# Patient Record
Sex: Male | Born: 1945 | Race: White | Hispanic: No | Marital: Married | Smoking: Current every day smoker
Health system: Southern US, Community
[De-identification: ages and names within clinical notes are randomized; demographics above are authoritative.]

## PROBLEM LIST (undated history)

## (undated) DIAGNOSIS — E785 Hyperlipidemia, unspecified: Secondary | ICD-10-CM

## (undated) DIAGNOSIS — R55 Syncope and collapse: Secondary | ICD-10-CM

## (undated) DIAGNOSIS — I10 Essential (primary) hypertension: Secondary | ICD-10-CM

## (undated) DIAGNOSIS — J449 Chronic obstructive pulmonary disease, unspecified: Secondary | ICD-10-CM

## (undated) DIAGNOSIS — I2510 Atherosclerotic heart disease of native coronary artery without angina pectoris: Secondary | ICD-10-CM

## (undated) HISTORY — DX: Chronic obstructive pulmonary disease, unspecified: J44.9

## (undated) HISTORY — DX: Syncope and collapse: R55

## (undated) HISTORY — PX: COLONOSCOPY: SHX174

## (undated) HISTORY — PX: TONSILLECTOMY: SUR1361

## (undated) HISTORY — DX: Atherosclerotic heart disease of native coronary artery without angina pectoris: I25.10

## (undated) HISTORY — PX: OTHER SURGICAL HISTORY: SHX169

## (undated) HISTORY — DX: Essential (primary) hypertension: I10

## (undated) HISTORY — DX: Hyperlipidemia, unspecified: E78.5

---

## 1997-11-15 ENCOUNTER — Emergency Department (HOSPITAL_COMMUNITY): Admission: EM | Admit: 1997-11-15 | Discharge: 1997-11-15 | Payer: Self-pay | Admitting: Emergency Medicine

## 1997-11-23 ENCOUNTER — Ambulatory Visit (HOSPITAL_COMMUNITY): Admission: RE | Admit: 1997-11-23 | Discharge: 1997-11-23 | Payer: Self-pay | Admitting: Internal Medicine

## 1998-10-12 ENCOUNTER — Emergency Department (HOSPITAL_COMMUNITY): Admission: EM | Admit: 1998-10-12 | Discharge: 1998-10-12 | Payer: Self-pay | Admitting: Emergency Medicine

## 1998-10-12 ENCOUNTER — Encounter: Payer: Self-pay | Admitting: Emergency Medicine

## 1999-10-02 ENCOUNTER — Encounter: Admission: RE | Admit: 1999-10-02 | Discharge: 1999-10-02 | Payer: Self-pay | Admitting: Internal Medicine

## 1999-10-02 ENCOUNTER — Encounter: Payer: Self-pay | Admitting: Internal Medicine

## 2005-10-11 ENCOUNTER — Ambulatory Visit: Payer: Self-pay | Admitting: Internal Medicine

## 2005-10-29 ENCOUNTER — Ambulatory Visit: Payer: Self-pay | Admitting: Internal Medicine

## 2005-10-29 ENCOUNTER — Encounter: Payer: Self-pay | Admitting: Internal Medicine

## 2008-07-26 HISTORY — PX: CORONARY ARTERY BYPASS GRAFT: SHX141

## 2008-07-27 ENCOUNTER — Inpatient Hospital Stay (HOSPITAL_COMMUNITY): Admission: EM | Admit: 2008-07-27 | Discharge: 2008-08-07 | Payer: Self-pay | Admitting: Emergency Medicine

## 2008-07-28 ENCOUNTER — Encounter: Payer: Self-pay | Admitting: Cardiothoracic Surgery

## 2008-07-28 ENCOUNTER — Ambulatory Visit: Payer: Self-pay | Admitting: Cardiothoracic Surgery

## 2008-07-28 HISTORY — PX: CARDIAC CATHETERIZATION: SHX172

## 2008-07-29 ENCOUNTER — Encounter: Payer: Self-pay | Admitting: Cardiothoracic Surgery

## 2008-07-29 HISTORY — PX: TRANSTHORACIC ECHOCARDIOGRAM: SHX275

## 2008-08-30 ENCOUNTER — Ambulatory Visit: Payer: Self-pay | Admitting: Cardiothoracic Surgery

## 2008-08-30 ENCOUNTER — Encounter: Admission: RE | Admit: 2008-08-30 | Discharge: 2008-08-30 | Payer: Self-pay | Admitting: Cardiothoracic Surgery

## 2010-02-03 ENCOUNTER — Emergency Department (HOSPITAL_COMMUNITY): Admission: EM | Admit: 2010-02-03 | Discharge: 2010-02-03 | Payer: Self-pay | Admitting: Emergency Medicine

## 2010-02-24 DIAGNOSIS — R55 Syncope and collapse: Secondary | ICD-10-CM

## 2010-02-24 HISTORY — DX: Syncope and collapse: R55

## 2010-08-10 LAB — DIFFERENTIAL
Basophils Absolute: 0.1 K/uL (ref 0.0–0.1)
Basophils Relative: 0 % (ref 0–1)
Eosinophils Absolute: 0.1 K/uL (ref 0.0–0.7)
Eosinophils Relative: 0 % (ref 0–5)
Lymphocytes Relative: 14 % (ref 12–46)
Lymphs Abs: 2.8 K/uL (ref 0.7–4.0)
Monocytes Absolute: 1 K/uL (ref 0.1–1.0)
Monocytes Relative: 5 % (ref 3–12)
Neutro Abs: 16.1 10*3/uL — ABNORMAL HIGH (ref 1.7–7.7)
Neutrophils Relative %: 80 % — ABNORMAL HIGH (ref 43–77)

## 2010-08-10 LAB — BASIC METABOLIC PANEL
Calcium: 9.1 mg/dL (ref 8.4–10.5)
GFR calc Af Amer: >60 mL/min (ref >60)
GFR calc non Af Amer: >60 mL/min (ref >60)
Potassium: 5.3 mEq/L — ABNORMAL HIGH (ref 3.5–5.1)
Sodium: 137 mEq/L (ref 135–145)

## 2010-08-10 LAB — BASIC METABOLIC PANEL WITH GFR
BUN: 11 mg/dL (ref 6–23)
CO2: 21 meq/L (ref 19–32)
Chloride: 108 meq/L (ref 96–112)
Creatinine, Ser: 1.04 mg/dL (ref 0.4–1.5)
Glucose, Bld: 119 mg/dL — ABNORMAL HIGH (ref 70–99)

## 2010-08-10 LAB — POCT CARDIAC MARKERS
CKMB, poc: <1 ng/mL — ABNORMAL LOW (ref 1.0–8.0)
CKMB, poc: <1 ng/mL — ABNORMAL LOW (ref 1.0–8.0)
Myoglobin, poc: 52.1 ng/mL (ref 12–200)
Myoglobin, poc: 55.8 ng/mL (ref 12–200)
Troponin i, poc: <0.05 ng/mL (ref 0.00–0.09)
Troponin i, poc: <0.05 ng/mL (ref 0.00–0.09)

## 2010-08-10 LAB — CBC
HCT: 45.2 % (ref 39.0–52.0)
Hemoglobin: 15.6 g/dL (ref 13.0–17.0)
MCH: 32 pg (ref 26.0–34.0)
MCHC: 34.5 g/dL (ref 30.0–36.0)
MCV: 92.8 fL (ref 78.0–100.0)
Platelets: 220 K/uL (ref 150–400)
RBC: 4.87 MIL/uL (ref 4.22–5.81)
RDW: 13.2 % (ref 11.5–15.5)
WBC: 20.1 10*3/uL — ABNORMAL HIGH (ref 4.0–10.5)

## 2010-08-10 LAB — D-DIMER, QUANTITATIVE: D-Dimer, Quant: <0.22 ug{FEU}/mL (ref 0.00–0.48)

## 2010-08-10 LAB — PROTIME-INR
INR: 1.07 (ref 0.00–1.49)
Prothrombin Time: 14.1 seconds (ref 11.6–15.2)

## 2010-08-10 LAB — APTT: aPTT: 27 s (ref 24–37)

## 2010-08-10 LAB — GLUCOSE, CAPILLARY: Glucose-Capillary: 131 mg/dL — ABNORMAL HIGH (ref 70–99)

## 2010-09-07 LAB — BASIC METABOLIC PANEL
BUN: 11 mg/dL (ref 6–23)
BUN: 11 mg/dL (ref 6–23)
BUN: 12 mg/dL (ref 6–23)
BUN: 13 mg/dL (ref 6–23)
CO2: 24 mEq/L (ref 19–32)
CO2: 25 mEq/L (ref 19–32)
CO2: 29 mEq/L (ref 19–32)
CO2: 29 mEq/L (ref 19–32)
Calcium: 8.2 mg/dL — ABNORMAL LOW (ref 8.4–10.5)
Calcium: 8.2 mg/dL — ABNORMAL LOW (ref 8.4–10.5)
Calcium: 8.3 mg/dL — ABNORMAL LOW (ref 8.4–10.5)
Calcium: 8.5 mg/dL (ref 8.4–10.5)
Chloride: 101 mEq/L (ref 96–112)
Chloride: 103 mEq/L (ref 96–112)
Chloride: 105 mEq/L (ref 96–112)
Chloride: 108 mEq/L (ref 96–112)
Creatinine, Ser: 0.74 mg/dL (ref 0.4–1.5)
Creatinine, Ser: 0.8 mg/dL (ref 0.4–1.5)
Creatinine, Ser: 0.8 mg/dL (ref 0.4–1.5)
Creatinine, Ser: 0.82 mg/dL (ref 0.4–1.5)
GFR calc Af Amer: >60 mL/min (ref >60)
GFR calc Af Amer: >60 mL/min (ref >60)
GFR calc Af Amer: >60 mL/min (ref >60)
GFR calc Af Amer: >60 mL/min (ref >60)
GFR calc non Af Amer: >60 mL/min (ref >60)
GFR calc non Af Amer: >60 mL/min (ref >60)
GFR calc non Af Amer: >60 mL/min (ref >60)
GFR calc non Af Amer: >60 mL/min (ref >60)
Glucose, Bld: 102 mg/dL — ABNORMAL HIGH (ref 70–99)
Glucose, Bld: 111 mg/dL — ABNORMAL HIGH (ref 70–99)
Glucose, Bld: 112 mg/dL — ABNORMAL HIGH (ref 70–99)
Glucose, Bld: 121 mg/dL — ABNORMAL HIGH (ref 70–99)
Potassium: 3.6 mEq/L (ref 3.5–5.1)
Potassium: 3.9 mEq/L (ref 3.5–5.1)
Potassium: 4 mEq/L (ref 3.5–5.1)
Potassium: 4.2 mEq/L (ref 3.5–5.1)
Sodium: 137 mEq/L (ref 135–145)
Sodium: 138 mEq/L (ref 135–145)
Sodium: 138 mEq/L (ref 135–145)
Sodium: 138 mEq/L (ref 135–145)

## 2010-09-07 LAB — BLOOD GAS, ARTERIAL
Acid-Base Excess: 0.5 mmol/L (ref 0.0–2.0)
Acid-Base Excess: 0.9 mmol/L (ref 0.0–2.0)
Bicarbonate: 24.7 mEq/L — ABNORMAL HIGH (ref 20.0–24.0)
Bicarbonate: 25.1 mEq/L — ABNORMAL HIGH (ref 20.0–24.0)
FIO2: 0.21 %
FIO2: 0.21 %
O2 Saturation: 95.5 %
O2 Saturation: 96.1 %
Patient temperature: 98.6
Patient temperature: 98.6
TCO2: 25.9 mmol/L (ref 0–100)
TCO2: 26.4 mmol/L (ref 0–100)
pCO2 arterial: 39.8 mmHg (ref 35.0–45.0)
pCO2 arterial: 41.5 mmHg (ref 35.0–45.0)
pH, Arterial: 7.4 (ref 7.350–7.450)
pH, Arterial: 7.408 (ref 7.350–7.450)
pO2, Arterial: 73.1 mmHg — ABNORMAL LOW (ref 80.0–100.0)
pO2, Arterial: 80.6 mmHg (ref 80.0–100.0)

## 2010-09-07 LAB — CBC
HCT: 26.5 % — ABNORMAL LOW (ref 39.0–52.0)
HCT: 27 % — ABNORMAL LOW (ref 39.0–52.0)
HCT: 27.3 % — ABNORMAL LOW (ref 39.0–52.0)
HCT: 27.9 % — ABNORMAL LOW (ref 39.0–52.0)
HCT: 28.1 % — ABNORMAL LOW (ref 39.0–52.0)
HCT: 29.3 % — ABNORMAL LOW (ref 39.0–52.0)
HCT: 30.5 % — ABNORMAL LOW (ref 39.0–52.0)
HCT: 41.2 % (ref 39.0–52.0)
HCT: 41.4 % (ref 39.0–52.0)
HCT: 42.6 % (ref 39.0–52.0)
HCT: 42.7 % (ref 39.0–52.0)
HCT: 43.4 % (ref 39.0–52.0)
HCT: 44.7 % (ref 39.0–52.0)
Hemoglobin: 10.4 g/dL — ABNORMAL LOW (ref 13.0–17.0)
Hemoglobin: 10.7 g/dL — ABNORMAL LOW (ref 13.0–17.0)
Hemoglobin: 14.2 g/dL (ref 13.0–17.0)
Hemoglobin: 14.3 g/dL (ref 13.0–17.0)
Hemoglobin: 14.8 g/dL (ref 13.0–17.0)
Hemoglobin: 14.8 g/dL (ref 13.0–17.0)
Hemoglobin: 15.6 g/dL (ref 13.0–17.0)
Hemoglobin: 9.4 g/dL — ABNORMAL LOW (ref 13.0–17.0)
Hemoglobin: 9.6 g/dL — ABNORMAL LOW (ref 13.0–17.0)
Hemoglobin: 9.7 g/dL — ABNORMAL LOW (ref 13.0–17.0)
Hemoglobin: 9.8 g/dL — ABNORMAL LOW (ref 13.0–17.0)
Hemoglobin: 9.8 g/dL — ABNORMAL LOW (ref 13.0–17.0)
MCHC: 34.3 g/dL (ref 30.0–36.0)
MCHC: 34.4 g/dL (ref 30.0–36.0)
MCHC: 34.5 g/dL (ref 30.0–36.0)
MCHC: 34.7 g/dL (ref 30.0–36.0)
MCHC: 34.8 g/dL (ref 30.0–36.0)
MCHC: 34.9 g/dL (ref 30.0–36.0)
MCHC: 34.9 g/dL (ref 30.0–36.0)
MCHC: 34.9 g/dL (ref 30.0–36.0)
MCHC: 35.1 g/dL (ref 30.0–36.0)
MCHC: 35.2 g/dL (ref 30.0–36.0)
MCHC: 35.5 g/dL (ref 30.0–36.0)
MCHC: 35.7 g/dL (ref 30.0–36.0)
MCHC: 35.9 g/dL (ref 30.0–36.0)
MCHC: 36 g/dL (ref 30.0–36.0)
MCV: 91.7 fL (ref 78.0–100.0)
MCV: 92.1 fL (ref 78.0–100.0)
MCV: 92.4 fL (ref 78.0–100.0)
MCV: 92.5 fL (ref 78.0–100.0)
MCV: 92.7 fL (ref 78.0–100.0)
MCV: 92.8 fL (ref 78.0–100.0)
MCV: 92.9 fL (ref 78.0–100.0)
MCV: 93.4 fL (ref 78.0–100.0)
MCV: 93.5 fL (ref 78.0–100.0)
MCV: 93.5 fL (ref 78.0–100.0)
MCV: 93.6 fL (ref 78.0–100.0)
MCV: 94.6 fL (ref 78.0–100.0)
Platelets: 119 10*3/uL — ABNORMAL LOW (ref 150–400)
Platelets: 132 10*3/uL — ABNORMAL LOW (ref 150–400)
Platelets: 140 10*3/uL — ABNORMAL LOW (ref 150–400)
Platelets: 143 10*3/uL — ABNORMAL LOW (ref 150–400)
Platelets: 152 10*3/uL (ref 150–400)
Platelets: 167 10*3/uL (ref 150–400)
Platelets: 184 10*3/uL (ref 150–400)
Platelets: 200 10*3/uL (ref 150–400)
Platelets: 203 10*3/uL (ref 150–400)
Platelets: 207 10*3/uL (ref 150–400)
Platelets: 216 10*3/uL (ref 150–400)
Platelets: 217 10*3/uL (ref 150–400)
Platelets: 230 10*3/uL (ref 150–400)
RBC: 2.86 MIL/uL — ABNORMAL LOW (ref 4.22–5.81)
RBC: 2.93 MIL/uL — ABNORMAL LOW (ref 4.22–5.81)
RBC: 2.94 MIL/uL — ABNORMAL LOW (ref 4.22–5.81)
RBC: 2.97 MIL/uL — ABNORMAL LOW (ref 4.22–5.81)
RBC: 3 MIL/uL — ABNORMAL LOW (ref 4.22–5.81)
RBC: 3.2 MIL/uL — ABNORMAL LOW (ref 4.22–5.81)
RBC: 3.29 MIL/uL — ABNORMAL LOW (ref 4.22–5.81)
RBC: 4.41 MIL/uL (ref 4.22–5.81)
RBC: 4.43 MIL/uL (ref 4.22–5.81)
RBC: 4.83 MIL/uL (ref 4.22–5.81)
RDW: 13.4 % (ref 11.5–15.5)
RDW: 13.4 % (ref 11.5–15.5)
RDW: 13.5 % (ref 11.5–15.5)
RDW: 13.6 % (ref 11.5–15.5)
RDW: 13.7 % (ref 11.5–15.5)
RDW: 13.7 % (ref 11.5–15.5)
RDW: 13.8 % (ref 11.5–15.5)
RDW: 13.8 % (ref 11.5–15.5)
RDW: 13.9 % (ref 11.5–15.5)
RDW: 14 % (ref 11.5–15.5)
RDW: 14 % (ref 11.5–15.5)
RDW: 14 % (ref 11.5–15.5)
RDW: 14.2 % (ref 11.5–15.5)
WBC: 10.2 10*3/uL (ref 4.0–10.5)
WBC: 11.4 10*3/uL — ABNORMAL HIGH (ref 4.0–10.5)
WBC: 11.6 10*3/uL — ABNORMAL HIGH (ref 4.0–10.5)
WBC: 12 10*3/uL — ABNORMAL HIGH (ref 4.0–10.5)
WBC: 12.2 10*3/uL — ABNORMAL HIGH (ref 4.0–10.5)
WBC: 13.4 10*3/uL — ABNORMAL HIGH (ref 4.0–10.5)
WBC: 14.5 10*3/uL — ABNORMAL HIGH (ref 4.0–10.5)
WBC: 15.5 10*3/uL — ABNORMAL HIGH (ref 4.0–10.5)
WBC: 17.4 10*3/uL — ABNORMAL HIGH (ref 4.0–10.5)
WBC: 9.6 10*3/uL (ref 4.0–10.5)

## 2010-09-07 LAB — POCT I-STAT 3, ART BLOOD GAS (G3+)
Acid-Base Excess: 1 mmol/L (ref 0.0–2.0)
Bicarbonate: 24.7 mEq/L — ABNORMAL HIGH (ref 20.0–24.0)
Bicarbonate: 24.7 mEq/L — ABNORMAL HIGH (ref 20.0–24.0)
Bicarbonate: 25.3 mEq/L — ABNORMAL HIGH (ref 20.0–24.0)
O2 Saturation: 100 %
Patient temperature: 35.9
Patient temperature: 37.6
TCO2: 28 mmol/L (ref 0–100)
pCO2 arterial: 39.7 mmHg (ref 35.0–45.0)
pCO2 arterial: 48.2 mmHg — ABNORMAL HIGH (ref 35.0–45.0)
pH, Arterial: 7.333 — ABNORMAL LOW (ref 7.350–7.450)
pH, Arterial: 7.359 (ref 7.350–7.450)
pH, Arterial: 7.362 (ref 7.350–7.450)
pO2, Arterial: 108 mmHg — ABNORMAL HIGH (ref 80.0–100.0)
pO2, Arterial: 136 mmHg — ABNORMAL HIGH (ref 80.0–100.0)
pO2, Arterial: 469 mmHg — ABNORMAL HIGH (ref 80.0–100.0)
pO2, Arterial: 63 mmHg — ABNORMAL LOW (ref 80.0–100.0)

## 2010-09-07 LAB — PROTIME-INR
INR: 0.9 (ref 0.00–1.49)
INR: 1 (ref 0.00–1.49)
INR: 1.5 (ref 0.00–1.49)
Prothrombin Time: 12.5 seconds (ref 11.6–15.2)
Prothrombin Time: 13.7 seconds (ref 11.6–15.2)
Prothrombin Time: 18.7 seconds — ABNORMAL HIGH (ref 11.6–15.2)

## 2010-09-07 LAB — CROSSMATCH
ABO/RH(D): A POS
Antibody Screen: NEGATIVE

## 2010-09-07 LAB — COMPREHENSIVE METABOLIC PANEL
ALT: 22 U/L (ref 0–53)
ALT: 40 U/L (ref 0–53)
AST: 22 U/L (ref 0–37)
AST: 28 U/L (ref 0–37)
Albumin: 3.7 g/dL (ref 3.5–5.2)
Albumin: 3.9 g/dL (ref 3.5–5.2)
Alkaline Phosphatase: 77 U/L (ref 39–117)
BUN: 13 mg/dL (ref 6–23)
BUN: 8 mg/dL (ref 6–23)
CO2: 26 mEq/L (ref 19–32)
CO2: 29 mEq/L (ref 19–32)
Calcium: 8.7 mg/dL (ref 8.4–10.5)
Calcium: 9.5 mg/dL (ref 8.4–10.5)
Calcium: 9.8 mg/dL (ref 8.4–10.5)
Chloride: 104 mEq/L (ref 96–112)
Creatinine, Ser: 0.76 mg/dL (ref 0.4–1.5)
Creatinine, Ser: 0.81 mg/dL (ref 0.4–1.5)
Creatinine, Ser: 0.96 mg/dL (ref 0.4–1.5)
GFR calc Af Amer: >60 mL/min (ref >60)
GFR calc Af Amer: >60 mL/min (ref >60)
GFR calc non Af Amer: >60 mL/min (ref >60)
GFR calc non Af Amer: >60 mL/min (ref >60)
Glucose, Bld: 105 mg/dL — ABNORMAL HIGH (ref 70–99)
Glucose, Bld: 107 mg/dL — ABNORMAL HIGH (ref 70–99)
Potassium: 4.4 mEq/L (ref 3.5–5.1)
Sodium: 141 mEq/L (ref 135–145)
Total Bilirubin: 0.9 mg/dL (ref 0.3–1.2)
Total Protein: 5.8 g/dL — ABNORMAL LOW (ref 6.0–8.3)
Total Protein: 6.8 g/dL (ref 6.0–8.3)

## 2010-09-07 LAB — MAGNESIUM
Magnesium: 2.1 mg/dL (ref 1.5–2.5)
Magnesium: 2.2 mg/dL (ref 1.5–2.5)
Magnesium: 2.4 mg/dL (ref 1.5–2.5)
Magnesium: 2.7 mg/dL — ABNORMAL HIGH (ref 1.5–2.5)

## 2010-09-07 LAB — POCT I-STAT, CHEM 8
BUN: 10 mg/dL (ref 6–23)
Calcium, Ion: 1.15 mmol/L (ref 1.12–1.32)
Chloride: 101 mEq/L (ref 96–112)
Chloride: 105 mEq/L (ref 96–112)
Creatinine, Ser: 0.8 mg/dL (ref 0.4–1.5)
Glucose, Bld: 123 mg/dL — ABNORMAL HIGH (ref 70–99)
Glucose, Bld: 134 mg/dL — ABNORMAL HIGH (ref 70–99)
HCT: 31 % — ABNORMAL LOW (ref 39.0–52.0)
HCT: 31 % — ABNORMAL LOW (ref 39.0–52.0)
Hemoglobin: 10.5 g/dL — ABNORMAL LOW (ref 13.0–17.0)
Hemoglobin: 10.5 g/dL — ABNORMAL LOW (ref 13.0–17.0)
Potassium: 3.8 mEq/L (ref 3.5–5.1)
Potassium: 4.4 mEq/L (ref 3.5–5.1)
Sodium: 139 mEq/L (ref 135–145)
TCO2: 24 mmol/L (ref 0–100)

## 2010-09-07 LAB — POCT I-STAT 4, (NA,K, GLUC, HGB,HCT)
Glucose, Bld: 84 mg/dL (ref 70–99)
Glucose, Bld: 87 mg/dL (ref 70–99)
HCT: 28 % — ABNORMAL LOW (ref 39.0–52.0)
HCT: 29 % — ABNORMAL LOW (ref 39.0–52.0)
HCT: 31 % — ABNORMAL LOW (ref 39.0–52.0)
HCT: 43 % (ref 39.0–52.0)
HCT: 43 % (ref 39.0–52.0)
Hemoglobin: 14.6 g/dL (ref 13.0–17.0)
Hemoglobin: 14.6 g/dL (ref 13.0–17.0)
Hemoglobin: 9.5 g/dL — ABNORMAL LOW (ref 13.0–17.0)
Hemoglobin: 9.9 g/dL — ABNORMAL LOW (ref 13.0–17.0)
Potassium: 3 mEq/L — ABNORMAL LOW (ref 3.5–5.1)
Potassium: 3.8 mEq/L (ref 3.5–5.1)
Potassium: 3.9 mEq/L (ref 3.5–5.1)
Potassium: 4 mEq/L (ref 3.5–5.1)
Sodium: 136 mEq/L (ref 135–145)
Sodium: 139 mEq/L (ref 135–145)
Sodium: 139 mEq/L (ref 135–145)
Sodium: 143 mEq/L (ref 135–145)
Sodium: 143 mEq/L (ref 135–145)

## 2010-09-07 LAB — URIC ACID: Uric Acid, Serum: 5.4 mg/dL (ref 4.0–7.8)

## 2010-09-07 LAB — HEMOGLOBIN AND HEMATOCRIT, BLOOD
HCT: 28.2 % — ABNORMAL LOW (ref 39.0–52.0)
Hemoglobin: 9.8 g/dL — ABNORMAL LOW (ref 13.0–17.0)

## 2010-09-07 LAB — CREATININE, SERUM
Creatinine, Ser: 0.78 mg/dL (ref 0.4–1.5)
Creatinine, Ser: 0.83 mg/dL (ref 0.4–1.5)
GFR calc Af Amer: >60 mL/min (ref >60)
GFR calc Af Amer: >60 mL/min (ref >60)
GFR calc non Af Amer: >60 mL/min (ref >60)
GFR calc non Af Amer: >60 mL/min (ref >60)

## 2010-09-07 LAB — GLUCOSE, CAPILLARY
Glucose-Capillary: 100 mg/dL — ABNORMAL HIGH (ref 70–99)
Glucose-Capillary: 107 mg/dL — ABNORMAL HIGH (ref 70–99)
Glucose-Capillary: 114 mg/dL — ABNORMAL HIGH (ref 70–99)
Glucose-Capillary: 117 mg/dL — ABNORMAL HIGH (ref 70–99)
Glucose-Capillary: 120 mg/dL — ABNORMAL HIGH (ref 70–99)
Glucose-Capillary: 122 mg/dL — ABNORMAL HIGH (ref 70–99)
Glucose-Capillary: 123 mg/dL — ABNORMAL HIGH (ref 70–99)
Glucose-Capillary: 126 mg/dL — ABNORMAL HIGH (ref 70–99)
Glucose-Capillary: 132 mg/dL — ABNORMAL HIGH (ref 70–99)
Glucose-Capillary: 87 mg/dL (ref 70–99)
Glucose-Capillary: 95 mg/dL (ref 70–99)

## 2010-09-07 LAB — POCT CARDIAC MARKERS: Myoglobin, poc: 39.5 ng/mL (ref 12–200)

## 2010-09-07 LAB — PREPARE PLATELETS

## 2010-09-07 LAB — HEMOGLOBIN A1C
Hgb A1c MFr Bld: 6 % (ref 4.6–6.1)
Mean Plasma Glucose: 126 mg/dL

## 2010-09-07 LAB — CARDIAC PANEL(CRET KIN+CKTOT+MB+TROPI)
CK, MB: 0.9 ng/mL (ref 0.3–4.0)
Total CK: 54 U/L (ref 7–232)
Troponin I: 0.09 ng/mL — ABNORMAL HIGH (ref 0.00–0.06)

## 2010-09-07 LAB — LIPID PANEL
Cholesterol: 214 mg/dL — ABNORMAL HIGH (ref 0–200)
HDL: 27 mg/dL — ABNORMAL LOW (ref >39)
Total CHOL/HDL Ratio: 7.9 RATIO
Triglycerides: 160 mg/dL — ABNORMAL HIGH (ref <150)
VLDL: 32 mg/dL (ref 0–40)

## 2010-09-07 LAB — HEPARIN LEVEL (UNFRACTIONATED)
Heparin Unfractionated: 0.27 IU/mL — ABNORMAL LOW (ref 0.30–0.70)
Heparin Unfractionated: 0.31 IU/mL (ref 0.30–0.70)
Heparin Unfractionated: 0.39 IU/mL (ref 0.30–0.70)
Heparin Unfractionated: 0.56 IU/mL (ref 0.30–0.70)
Heparin Unfractionated: <0.1 IU/mL — ABNORMAL LOW (ref 0.30–0.70)
Heparin Unfractionated: <0.1 IU/mL — ABNORMAL LOW (ref 0.30–0.70)

## 2010-09-07 LAB — CK TOTAL AND CKMB (NOT AT ARMC): CK, MB: 1.9 ng/mL (ref 0.3–4.0)

## 2010-09-07 LAB — TROPONIN I: Troponin I: 0.08 ng/mL — ABNORMAL HIGH (ref 0.00–0.06)

## 2010-09-07 LAB — URINALYSIS, ROUTINE W REFLEX MICROSCOPIC
Bilirubin Urine: NEGATIVE
Hgb urine dipstick: NEGATIVE
Ketones, ur: NEGATIVE mg/dL
Protein, ur: NEGATIVE mg/dL
Specific Gravity, Urine: 1.007 (ref 1.005–1.030)
Urobilinogen, UA: 1 mg/dL (ref 0.0–1.0)

## 2010-09-07 LAB — APTT
aPTT: 121 seconds — ABNORMAL HIGH (ref 24–37)
aPTT: 32 seconds (ref 24–37)
aPTT: 37 seconds (ref 24–37)

## 2010-10-10 NOTE — Op Note (Signed)
Jeff [name], HOLBERG NO.:  [id number]   MEDICAL RECORD NO.:  [id number]          PATIENT TYPE:  INP   LOCATION:  2303                         FACILITY:  [facility]   PHYSICIAN:  Kerin Perna, M.D.  DATE OF BIRTH:  [birth year]   DATE OF PROCEDURE:  08/03/2008  DATE OF DISCHARGE:                               OPERATIVE REPORT   OPERATION:  1. Coronary artery bypass grafting x5 (left internal mammary artery to      left anterior descending, saphenous vein graft to diagonal,      sequential saphenous vein graft to obtuse marginal 1 and obtuse      marginal 2, saphenous vein graft to distal right coronary artery.  2. Endoscopic harvest of right leg greater saphenous vein.   SURGEON:  Kerin Perna, MD   ASSISTANT:  Stephanie Acre Dominick, PA-C   ANESTHESIA:  General.   PREOPERATIVE DIAGNOSIS:  Class IV unstable angina with severe three-  vessel coronary artery disease.   POSTOPERATIVE DIAGNOSIS:  Class IV unstable angina with severe three-  vessel coronary artery disease.   INDICATIONS:  The patient is a 65 year old hypertensive male presented  with progressive chest pain and mildly elevated cardiac enzymes.  He was  admitted to the CCU and placed on heparin, nitroglycerin and then  underwent cardiac catheterization.  This demonstrated severe multivessel  coronary artery disease with high-grade 95% stenosis of the LAD, right  coronary and 80% stenosis of the circumflex system.  His LV function was  preserved and he was felt to be a candidate for surgical  revascularization.   Prior to surgery, I examined the patient in his CCU room on several  occasions and read the results of cardiac cath.  I discussed the  indications, benefits and alternatives of coronary artery bypass surgery  for treatment of his multivessel coronary artery disease.  I discussed  with him the major details of surgery including the location of the  surgical incisions, the use of general  anesthesia and cardiopulmonary  bypass, the expected postoperative recovery, and the associated risks  including bleeding, blood transfusion, infection, stroke, MI, and death.  After reviewing these issues during our visits he demonstrated his  understanding and agreed to proceed with surgery and what I felt was an  informed consent.   OPERATIVE FINDINGS:  1. Moderate COPD - emphysema.  2. Good quality conduit from the saphenous vein and somewhat small      left IMA but with adequate flow.  3. Diffuse plaquing of the left coronary system but with discrete high-      grade proximal stenoses.   PROCEDURE:  The patient was brought to operating room and placed supine  on the operating table where general anesthesia was induced.  The chest,  abdomen and legs were prepped with Betadine and draped as a sterile  field.  A sternal incision was made and the saphenous vein was harvested  endoscopically from the left leg.  The left internal mammary artery was  harvested as a pedicle graft from its origin at the subclavian vessels.  It was somewhat  small but had excellent flow and was trimmed back to a  somewhat larger size to enhance the flow.  After the vein had been  inspected and found to be adequate, heparin was administered and the ACT  was documented as being therapeutic.  The pericardium was opened and  suspended.  Pursestrings were placed in the ascending aorta and right  atrium and the patient was cannulated and placed on bypass.  The  coronaries were identified for grafting.  The vessels were fairly good  target  other than the diagonal which was small and fairly heavily  diseased but graftable.  The mammary artery and vein grafts were  prepared for the distal anastomoses and antegrade and retrograde  cardioplegic catheters were positioned.  The patient was cooled to 32  degrees and aortic cross-clamp was applied.  Cold blood cardioplegia 1 L  was delivered in split doses between the  antegrade and retrograde  catheters and there was a good cardioplegic arrest.  Cardioplegia was  then delivered every 20 minutes or less.   The distal coronary anastomoses were then performed.  The first distal  anastomosis was the distal right.  The posterior descending had a  somewhat early bifurcation and was a 1.5 mm vessel with a probing passed  easily distally.  A reverse saphenous vein was sewn end-to-side with  running 7-0 Prolene with good flow through the graft.  The second and  third distal anastomoses consisted of a sequential vein graft to the OM  I and OM II.  The OMI was a somewhat larger vessel measuring 1.6 mm.  It  had a high-grade proximal stenosis.  A saphenous vein was sewn side-to-  side with running 7-0 Prolene with good flow through the graft.  The  third distal anastomosis was continuation of the sequential vein graft  at A1 and 2.  There was a somewhat small 1.4-1.5 mm vessel with a  proximal 50-60% stenosis.  The end of the vein was sewn end-to-side with  running 7-0 Prolene with good flow through the graft.  Cardioplegia was  redosed.  The fourth distal anastomosis was at the diagonal branch to  LAD.  This was a 1.2-mm vessel with a proximal 80% stenosis.  Reverse  saphenous vein was sewn end-to-side with running 7-0 Prolene with good  flow through the graft.  The fifth distal anastomosis was the distal  third of the LAD.  The LAD was diffusely diseased and thickened but had  a discrete high-grade proximal stenosis of 95%.  The left IMA pedicle  was brought through an opening created in the left lateral pericardium  this was brought down onto the LAD and sewn end-to-side with running 8-0  Prolene.  The mammary artery was somewhat snug due to the hyperinflated  lungs from emphysema and the pleura on the pedicle was incised in length  in the mammary pedicle.  This allowed the pedicle to sit in the  appropriate orientation without tension.  The mammary pedicle was  opened  to allow flow and there was a good flow through the anastomosis and  hemostasis was documented and established.  The bulldog was reapplied  and the pedicle was secured to epicardium and cardioplegia was redosed.   While the cross-clamp was still in place, three proximal vein  anastomoses were performed on the ascending aorta using a 4.0-mm punch  running 7-0 Prolene.  Air was vented from the coronaries with a dose of  retrograde warm blood cardioplegia prior to removing  the cross-clamp.   After the cross-clamp was removed, the heart resumed a spontaneous  rhythm and air was aspirated from the vein grafts.  The cardioplegic  catheters were removed.  The proximal and distal anastomoses were  checked and found to be hemostatic.  The patient was rewarmed to 37  degrees.  Temporary pacing wires were applied.  The lungs were re-  expanded and ventilator was resumed.  The patient was weaned from bypass  without difficulty.  He did not require dopamine.  He was in a sinus  rhythm.  Protamine was administered without adverse reaction.  The  cannula was removed and the mediastinum was irrigated with warm  antibiotic irrigation.  The patient had some diffuse coagulopathy and  the platelet count was low at 110,000 so we transfused 1 unit of  platelets.  The patient remained stable.  The superior pericardial fat  in the mediastinum was closed.  Two mediastinal and a left pleural chest  tube  were placed and brought out through separate incisions.  The sternum was  closed with interrupted steel wire.  The pectoralis fascia was closed  with a running #1 Vicryl.  The subcutaneous layer and skin were closed  with running Vicryl and sterile dressings applied.  The patient returned  to the SICU in stable condition.      Kerin Perna, M.D.  Electronically Signed     PV/MEDQ  D:  08/03/2008  T:  08/04/2008  Job:  [id number]   cc:   Gerlene Burdock A. Alanda Amass, M.D.

## 2010-10-10 NOTE — Discharge Summary (Signed)
Jeff [name], ERGLE NO.:  [id number]   MEDICAL RECORD NO.:  [id number]          PATIENT TYPE:  INP   LOCATION:  2011                         FACILITY:  [facility]   PHYSICIAN:  Kerin Perna, M.D.  DATE OF BIRTH:  07-24-1945   DATE OF ADMISSION:  07/27/2008  DATE OF DISCHARGE:  08/07/2008                               DISCHARGE SUMMARY   HISTORY:  The patient is a 65 year old white male ex-smoker who was  admitted this hospitalization for exertional upper abdominal and chest  discomfort.  These episodes have occurred over the last few weeks on  several different occasions.  The episodes would not be associated with  shortness of breath or diaphoresis.  There is no association of symptoms  with food, nausea or vomiting.  The patient denied any nocturnal or rest  symptoms.  The patient required admission for further evaluation and  treatment for presumed unstable angina symptoms.   MEDICATIONS PRIOR TO ADMISSION:  None.   ALLERGIES:  None.   PAST MEDICAL HISTORY/SURGICAL HISTORY:  Right shoulder surgery for an AC  separation while playing softball 2 years ago.   SOCIAL HISTORY:  The patient is a former smoker.  He quit tobacco 2  years ago.  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   FAMILY HISTORY:  Positive for coronary artery disease on his mother side  with several early deaths.   REVIEW OF SYSTEMS:  Please see the history and physical done at the time  of admission.   PHYSICAL EXAMINATION:  Please see the history and physical done at the  time of admission.   HOSPITAL COURSE:  The patient was admitted and Cardiology consultation  was obtained with [facility] with Dr.  Lynnea Ferrier.  The patient was felt to require cardiac catheterization to  better delineate his cardiac anatomy.  This was performed on July 28, 2008.  Findings were consistent with severe 3-vessel coronary artery  disease with good left ventricular function.   Due to these findings,  surgical consultation was obtained with Kerin Perna, MD who  evaluated the patient and studies and agreed with recommendations to  proceed with surgical revascularization as his best option due to the  severity of the anatomy and his increasing symptoms.  The patient was  medically stabilized and on August 03, 2008, he was taken to the operating  room, at which time he underwent the following procedure, coronary  artery bypass grafting x5.  The following grafts were placed,  1. Left internal mammary artery to the LAD.  2. Saphenous vein graft to the right coronary artery.  3. Saphenous vein graft to the diagonal coronary artery.  4. A sequential saphenous vein graft to the obtuse marginal 1 and      obtuse marginal #2.  The patient tolerated the procedure well, was      taken to the surgical intensive care unit in stable condition.   POSTOPERATIVE HOSPITAL COURSE:  The patient has done well.  He was  weaned from the ventilator without difficulty.  He has remained  neurologically  intact.  All routine lines, monitors, and drainage  devices have been discontinued in the standard fashion.  The patient did  have some early sinus bradycardia, but this has resolved.  His  activities have been increased using standard protocols.  He does have  an acute blood loss anemia.  His values are stabilized.  His most recent  hemoglobin and hematocrit dated July 09, 2008, are 9.8 and 27.3  respectively.  Electrolytes, BUN and creatinine are all within normal  limits.  The patient did have a postoperative leukocytosis, but this has  improved.  His most recent value of his white blood cell count is 12.2  on July 09, 2008.  There is no evidence of infection.  He is not  having fevers.  His incisions are all healing well without evidence of  erythema or drainage.  His oxygen has been weaned and he maintained good  saturations on room air.  He does have any moderate volume  overload, but  he is responding well to diuretics.  He will continue for a short period  postdischarge.  Overall, his status was felt to be quite stable for  tentative discharge in the morning of August 07, 2008, pending morning  round reevaluation.   MEDICATIONS ON DISCHARGE AT THE TIME OF THIS DICTATION:  1. Aspirin 325 mg daily.  2. Zocor 40 mg daily.  3. Lopressor 25 mg twice daily for pain.  4. Oxycodone 5 mg 1-2 q.4-6 h. as needed.  5. Lasix 40 mg daily for 5 additional days.  6. K-Dur 20 mEq daily for 5 additional days.   INSTRUCTIONS:  The patient will receive written instructions in regard  to medications, activity, diet, wound care, and followup.   FOLLOWUP:  Followup include Dr. Lynnea Ferrier, 2 week postdischarge Dr. Donata Clay, PA on August 30, 2008, at 1:15, at the [facility] office with a chest x-  ray.   FINAL DIAGNOSIS:  Severe 3-vessel coronary artery disease as described  above, now status post surgical revascularization.   OTHER DIAGNOSES:  1. Postoperative acute blood loss anemia.  2. Postoperative volume overload.  3. Hyperlipidemia.  Of note, the patient's values on July 28, 2008,      showed a LDL of 155 with an HDL of 27.  4. History of tobacco abuse.  5. Significant family history of coronary artery disease.      Jeff [name], P.A.-C.      Kerin Perna, M.D.  Electronically Signed   WEG/MEDQ  D:  08/06/2008  T:  08/07/2008  Job:  [id number]   cc:   Kerin Perna, M.D.  Jeff Slot, MD  Jeff [name], M.D.

## 2010-10-10 NOTE — H&P (Signed)
[name], [name] NO.:  [id number]   MEDICAL RECORD NO.:  [id number]          PATIENT TYPE:  EMS   LOCATION:  MAJO                         FACILITY:  [facility]   PHYSICIAN:  Lucita Ferrara, MD         DATE OF BIRTH:  1946/05/19   DATE OF ADMISSION:  07/27/2008  DATE OF DISCHARGE:                              HISTORY & PHYSICAL   PRIMARY CARE DOCTOR:  Erskine Speed, MD   CHIEF COMPLAINT:  Chest pain.   HISTORY OF PRESENT ILLNESS:  The patient is a 65 year old Caucasian male  who presents to [facility] with chest pain which occurred  supposedly while the patient was exerting himself.  However, the patient  has been having intermittent pressure-like chest pain that is located in  left anterior precordial area and mediastinal area, lasting for about 1-  2 minutes.  It occurs even while the patient is seated.  However, it is  worse upon exertion.  The patient denies any fevers, chills, cough.  The  patient denies any diaphoresis or shortness of breath.  There is no  radiation of pain.  The patient denies any association with food, nausea  or vomiting.  The patient denies any reproducibility or changes with  body position.  Supposedly, the patient had a stress test 2 years ago  which was essentially normal stress test after a syncopal episode.  Otherwise, the patient's 12-point review of systems is negative.  Risk  factors are relatively low.  There is no family history of coronary  disease.  The patient is a former smoker, and there is no documentation  of hypertension, hyperlipidemia or diabetes.   PAST MEDICAL HISTORY:  None.   PAST SURGICAL HISTORY:  Shoulder repair.   SOCIAL HISTORY:  The patient denies drugs, alcohol.  He is former  smoker.   ALLERGIES:  NO KNOWN DRUG ALLERGIES.   MEDICATIONS:  None.   EKG shows inverted T-waves in aVL, V2 and V3.  There are flattened T-  waves in lead I.  There is no old EKG to compare to.  His basic  metabolic panel is negative.  LFTs normal.  CBC normal.  Cardiac markers  negative.  Chest x-ray normal.   PHYSICAL EXAMINATION:  GENERAL:  Patient is a 65 year old male in no  acute distress at this point.  VITAL SIGNS:  Blood pressure 159/93, pulse 70, respirations 20,  temperature 98.3, pulse oximetry 100% on 2 L nasal cannula.  HEENT/NECK:  Normocephalic, atraumatic.  Sclerae are anicteric.  Neck is supple.  No  JVD, no carotid bruits.  PERRLA.  Extraocular muscles are intact.  CARDIOVASCULAR:  S1, S2, regular rate and rhythm.  No murmurs, rubs or  clicks.  LUNGS:  Clear to auscultation bilaterally.  No rhonchi, rales or wheeze.  ABDOMEN:  Soft, nontender, nondistended.  Positive bowel sounds.  EXTREMITIES:  No clubbing, cyanosis or edema.  NEURO:  Patient is oriented x3.  Cranial nerves II-XII are grossly  intact.   ASSESSMENT:  The patient is a 65 year old male with the following:  Chest pain, EKG  changes.  No old EKG to compare to.  Relatively low risk  although, he is hypertensive here.  There is no documentation of  hypertension or past medical history of such.  He is a former smoker.  There is no family history of coronary artery disease.   PLAN:  The patient will be admitted.  Cardiac enzymes will be cycled x2  q.8 h.  The patient will need to be evaluated with a nuclear imaging  thallium stress test.  The patient would likely benefit from cardiac  catheterization given focal EKG changes.  This would obviously need to  be determined by cardiology consultation.  DVT and GI prophylaxis with  Lovenox and Protonix.  Aspirin 325 mg p.o. daily. Metoprolol 12.5 mg  p.o. twice daily.  The rest of plans will depend on his progress and  consult recommendations.      Lucita Ferrara, MD  Electronically Signed     RR/MEDQ  D:  07/27/2008  T:  07/27/2008  Job:  [id number]

## 2010-10-10 NOTE — Consult Note (Signed)
NAMELEELAND, LOVELADY NO.:  [id number]   MEDICAL RECORD NO.:  [id number]          PATIENT TYPE:  INP   LOCATION:  2920                         FACILITY:  [facility]   PHYSICIAN:  Kerin Perna, M.D.  DATE OF BIRTH:  08/27/1945   DATE OF CONSULTATION:  07/28/2008  DATE OF DISCHARGE:                                 CONSULTATION   REQUESTING PHYSICIAN:  [name], MD   PRIMARY CARE PHYSICIAN:  Erskine Speed, MD   REASON FOR CONSULTATION:  Severe multivessel coronary disease with  unstable angina.   CHIEF COMPLAINT:  Epigastric pain.   HISTORY OF PRESENT ILLNESS:  I was asked to evaluate this 65 year old  Caucasian ex-smoker for potential multivessel coronary artery bypass  surgery for recently diagnosed severe 3-vessel coronary artery disease.  The patient has been having exertional upper abdominal and chest  discomfort lasting a few minutes for the past few weeks.  It is usually  without associated shortness of breath or diaphoresis.  There is no  association of symptoms with food, nausea, or vomiting.  The patient has  denied any nocturnal symptoms or resting symptoms.  The patient's risk  factors are significant for past history of smoking (quit 2 years ago)  and a strong family history of coronary artery disease and myocardial  infarction with death at an early age from heart disease.  There is no  history of dyslipidemia or diabetes or hypertension.   HOME MEDICATIONS:  None.   ALLERGIES:  None.   PAST MEDICAL HISTORY:  1. Status post right shoulder surgery for an AC separation while      playing softball 2 years ago without anesthetic complication.  2. Edentulous with upper plate.   SOCIAL HISTORY:  The patient does not smoke or drink.  He is married, 40  years this summer.  He has a retired Personnel officer.   FAMILY HISTORY:  Positive for coronary artery disease on his mother's  side with several early deaths.   REVIEW OF SYSTEMS:  The patient  has always remained active over the  years in sports and currently rides a bicycle.  He denies any difficulty  with swallowing.  He denies history of thoracic trauma.  He denies  history of arrhythmia or cardiac murmur or history of rheumatic fever as  a child.  GI review is negative for hepatitis, jaundice, blood per  rectum, gallstones.  GU review is negative for hematuria, kidney stones,  or BPH.  Endocrine review is negative for diabetes or thyroid disease.  Vascular review is negative for DVT, claudication, or TIA.  Hematologic  review is negative for bleeding disorder or prior blood transfusion.  Neurologic review is negative stroke or seizure.   PHYSICAL EXAMINATION:  VITAL SIGNS:  The patient is 5 feet 6 inches,  weighs 180 pounds, blood pressure 118/70, pulse 70 and regular,  respirations 18, temperature afebrile.  GENERAL APPEARANCE:  A middle-aged male in the CCU following cardiac  catheterization, in no acute distress.  HEENT:  Normocephalic.  Pupils are equal.  He is edentulous.  NECK:  Without JVD, mass, or  bruit.  LYMPHATICS:  No palpable cervical or supraclavicular adenopathy.  LUNGS:  Breath sounds are clear and there is no thoracic deformity.  CARDIAC:  Rhythm is regular without S3 gallop, murmur, or friction rub.  ABDOMEN:  Soft, nontender without pulsatile mass.  EXTREMITIES:  No clubbing, cyanosis, or edema.  Peripheral pulses are  positive in all extremities.  He has a compression dressing in right  groin following cardiac catheterization, this is dry without hematoma.  NEUROLOGIC:  Alert and oriented without focal motor deficit.   LABORATORY DATA:  His chest x-ray shows no active disease.  Creatinine  0.7, hemoglobin 16 g, platelet 230,000.  His troponin on admission was  0.08.  I reviewed the coronary arteriogram with Dr. Lynnea Ferrier and agreed  with his impression of severe multivessel disease with a 90% stenosis of  the LAD, 90% stenosis of the OM-1, and 50%  stenosis of the distal right  at the PDA origin.  His LV function is well preserved.   PLAN:  We will schedule the patient for multivessel surgical  revascularization at the first opportunity.  I have discussed the plan  and surgery with the patient.  He understands and agrees.   Thank you for the consultation.      Kerin Perna, M.D.  Electronically Signed     PV/MEDQ  D:  07/28/2008  T:  07/29/2008  Job:  [id number]   cc:   Erskine Speed, M.D.  [facility]

## 2010-10-10 NOTE — Assessment & Plan Note (Signed)
OFFICE VISIT   Jeff [name], Jeff [name]  DOB:  [birth year]                                        August 30, 2008  CHART #:  [id number]   HISTORY:  The patient is status post coronary artery bypass grafting x5  done by Dr. Donata Clay on August 03, 2008.  The patient's postoperative  course was pretty much unremarkable and he was discharged to home in  stable condition on August 07, 2008.  The patient presents today for his  3-week followup visit.  The patient states that he is doing quite well.  He denies any significant incisional pain.  He has pretty much stopped  taking his pain medications.  He denies any shortness of breath, nausea,  vomiting, opening or drainage from any of his incision sites.  He is up,  ambulating without difficulty.  He is tolerating diet well.  The patient  does have a followup appointment with Dr. Elsie Lincoln tomorrow.   PHYSICAL EXAMINATION:  VITAL SIGNS:  Blood pressure 114/73, pulse 76,  respirations of 18.  O2 sat 96% on room air.  GENERAL:  A 65 year old white male in no acute distress.  RESPIRATORY:  Clear to auscultation bilaterally.  CARDIAC:  Regular rate and rhythm.  No murmurs, gallops, rubs noted.  Sternum appears stable.  ABDOMEN:  Bowel sounds x4.  Soft, nontender.  EXTREMITIES:  No edema noted.  Warm and well perfused.  INCISIONS:  All clean, dry, and intact and healing well.   STUDIES:  The patient had a PA and lateral chest x-ray done today, which  shows a very small left pleural effusion noted.  Otherwise, clear and  stable.   IMPRESSION AND PLAN:  The patient is progressing extremely well  postoperatively.  We will plan to keep his appointment with Dr. Elsie Lincoln  tomorrow.  He is encouraged to do cardiac rehab for which he has  orientation this week, on Thursday.  The patient is told to continue  ambulating 3 to 4 times per day, progressing as tolerated.  He is told  do no heavy lifting over 10 pounds for another month.  He is  released to  drive short distances and increase gradually.   PLAN:  We will release the patient from our office and still if he has  any surgical issues, he is to contact us.  The patient is in agreement.   Kerin Perna, M.D.  Electronically Signed   KMD/MEDQ  D:  08/30/2008  T:  08/31/2008  Job:  [id number]   cc:   Kerin Perna, M.D.  Madaline Savage, M.D.

## 2010-10-12 ENCOUNTER — Other Ambulatory Visit: Payer: Self-pay | Admitting: Dermatology

## 2010-11-14 ENCOUNTER — Encounter: Payer: Self-pay | Admitting: Internal Medicine

## 2010-11-16 ENCOUNTER — Encounter: Payer: Self-pay | Admitting: Internal Medicine

## 2010-12-07 ENCOUNTER — Ambulatory Visit (AMBULATORY_SURGERY_CENTER): Payer: Medicare Other | Admitting: *Deleted

## 2010-12-07 VITALS — Ht 66.0 in | Wt 190.7 lb

## 2010-12-07 DIAGNOSIS — Z86010 Personal history of colonic polyps: Secondary | ICD-10-CM

## 2010-12-07 MED ORDER — PEG-KCL-NACL-NASULF-NA ASC-C 100 G PO SOLR: ORAL | Status: DC

## 2010-12-21 ENCOUNTER — Ambulatory Visit (AMBULATORY_SURGERY_CENTER): Payer: Medicare Other | Admitting: Internal Medicine

## 2010-12-21 ENCOUNTER — Encounter: Payer: Self-pay | Admitting: Internal Medicine

## 2010-12-21 VITALS — BP 148/71 | HR 60 | Temp 98.2°F | Resp 16 | Ht 66.0 in | Wt 188.0 lb

## 2010-12-21 DIAGNOSIS — Z86010 Personal history of colonic polyps: Secondary | ICD-10-CM

## 2010-12-21 DIAGNOSIS — Z1211 Encounter for screening for malignant neoplasm of colon: Secondary | ICD-10-CM

## 2010-12-21 DIAGNOSIS — D128 Benign neoplasm of rectum: Secondary | ICD-10-CM

## 2010-12-21 DIAGNOSIS — D129 Benign neoplasm of anus and anal canal: Secondary | ICD-10-CM

## 2010-12-21 DIAGNOSIS — D126 Benign neoplasm of colon, unspecified: Secondary | ICD-10-CM

## 2010-12-21 MED ORDER — SODIUM CHLORIDE 0.9 % IV SOLN: 500.0000 mL | INTRAVENOUS | Status: AC

## 2010-12-21 NOTE — Progress Notes (Signed)
Pt was uncomfortable while the scope was advanced.  Additional meds were administered per the md's orders.  Pt relaxed and rested comfortable on the way out of the cecum.  MAW  Pt tolerated the exam well. MAW

## 2010-12-21 NOTE — Patient Instructions (Signed)
Discharge instructions given with verbal understanding. Handout on polyps. Resume previous medications.

## 2010-12-22 ENCOUNTER — Telehealth: Payer: Self-pay

## 2010-12-22 NOTE — Telephone Encounter (Signed)

## 2010-12-26 ENCOUNTER — Encounter: Payer: Self-pay | Admitting: Internal Medicine

## 2011-04-23 ENCOUNTER — Other Ambulatory Visit: Payer: Self-pay | Admitting: Dermatology

## 2011-10-29 ENCOUNTER — Other Ambulatory Visit: Payer: Self-pay | Admitting: Dermatology

## 2012-06-09 ENCOUNTER — Other Ambulatory Visit: Payer: Self-pay | Admitting: Dermatology

## 2012-10-27 ENCOUNTER — Encounter: Payer: Self-pay | Admitting: Pharmacist Clinician (PhC)/ Clinical Pharmacy Specialist

## 2012-10-28 ENCOUNTER — Ambulatory Visit (INDEPENDENT_AMBULATORY_CARE_PROVIDER_SITE_OTHER): Payer: Medicare Other | Admitting: Cardiovascular Disease

## 2012-10-28 ENCOUNTER — Encounter: Payer: Self-pay | Admitting: Cardiovascular Disease

## 2012-10-28 VITALS — BP 148/90 | HR 57 | Resp 16 | Ht 66.0 in | Wt 191.1 lb

## 2012-10-28 DIAGNOSIS — I10 Essential (primary) hypertension: Secondary | ICD-10-CM

## 2012-10-28 DIAGNOSIS — Z9189 Other specified personal risk factors, not elsewhere classified: Secondary | ICD-10-CM

## 2012-10-28 DIAGNOSIS — I2510 Atherosclerotic heart disease of native coronary artery without angina pectoris: Secondary | ICD-10-CM

## 2012-10-28 DIAGNOSIS — I25810 Atherosclerosis of coronary artery bypass graft(s) without angina pectoris: Secondary | ICD-10-CM

## 2012-10-28 DIAGNOSIS — Z87898 Personal history of other specified conditions: Secondary | ICD-10-CM

## 2012-10-28 DIAGNOSIS — E785 Hyperlipidemia, unspecified: Secondary | ICD-10-CM

## 2012-10-28 DIAGNOSIS — E669 Obesity, unspecified: Secondary | ICD-10-CM

## 2012-10-28 NOTE — Patient Instructions (Addendum)
Your physician has requested that you regularly monitor and record your blood pressure readings at home or from your local pharmacy. Please use the same machine at the same time of day to check your readings and record them to bring to your follow-up visit. Please call the office if the top number is greater than 140, or if the bottom number is greater than 90, or both. Your physician recommends that you schedule a follow-up appointment in: 1 year (June 2015)

## 2012-11-18 ENCOUNTER — Encounter: Payer: Self-pay | Admitting: Cardiovascular Disease

## 2012-11-18 DIAGNOSIS — I2510 Atherosclerotic heart disease of native coronary artery without angina pectoris: Secondary | ICD-10-CM | POA: Insufficient documentation

## 2012-11-18 DIAGNOSIS — E663 Overweight: Secondary | ICD-10-CM | POA: Insufficient documentation

## 2012-11-18 DIAGNOSIS — I10 Essential (primary) hypertension: Secondary | ICD-10-CM | POA: Insufficient documentation

## 2012-11-18 DIAGNOSIS — E7800 Pure hypercholesterolemia, unspecified: Secondary | ICD-10-CM | POA: Insufficient documentation

## 2012-11-18 DIAGNOSIS — Z87898 Personal history of other specified conditions: Secondary | ICD-10-CM | POA: Insufficient documentation

## 2012-11-18 NOTE — Progress Notes (Signed)
Patient ID: Jeff [name], male   DOB: 1945/08/24, 67 y.o.   MRN: [id number]     Reason for office visit CAD status post CABG  Jeff [name] has had a good year from a cardiovascular standpoint without any new health challenges. His blood pressure is a little high today although he states that when he has checked it but the monitor as an outpatient has been normal. He has not had any new syncopal events. He denies angina or dyspnea on exertion.    Allergies  Allergen Reactions  . Zyban (Bupropion Hcl) Hives and Swelling    Current Outpatient Prescriptions  Medication Sig Dispense Refill  . aspirin 81 MG tablet Take 81 mg by mouth daily.        . metoprolol tartrate (LOPRESSOR) 25 MG tablet Take 25 mg by mouth 2 (two) times daily. Takes 1/2 tablet twice a day       . simvastatin (ZOCOR) 40 MG tablet Take 40 mg by mouth at bedtime.         Current Facility-Administered Medications  Medication Dose Route Frequency Provider Last Rate Last Dose  . 0.9 %  sodium chloride infusion  500 mL Intravenous Continuous Hart Carwin, MD        Past Medical History  Diagnosis Date  . Hyperlipidemia   . CAD (coronary artery disease)     post CABG x 5 in 07/2008  . Syncope 02/24/10    holter monitor - 24 hrs - normal tracing per [facility]  . Hypertension   . COPD (chronic obstructive pulmonary disease)     Past Surgical History  Procedure Laterality Date  . Coronary artery bypass graft  07/2008    5 vessels 2010 - Dr. Maren Beach  . Right shoulder surgery    . Tonsillectomy    . Colonoscopy    . Transthoracic echocardiogram  07/29/2008    see procedures tab  . Cardiac catheterization  07/28/2008    EF 65%, normal systolic function; 99% proximal lesion in LAD, 70% in CIRC, 99% ostial lesion in MARG1, 50% RCA distal lesion and 30% RCA proximal -  recommend CABG    Family History  Problem Relation Age of Onset  . Heart disease Mother   . Heart attack Mother   . Heart attack Maternal Grandmother   .  Diabetes Brother   . Diabetes Sister     History   Social History  . Marital Status: Single    Spouse Name: N/A    Number of Children: N/A  . Years of Education: N/A   Occupational History  . Not on file.   Social History Main Topics  . Smoking status: Former Smoker    Quit date: 12/06/2005  . Smokeless tobacco: Not on file  . Alcohol Use: No  . Drug Use: No  . Sexually Active: Not on file   Other Topics Concern  . Not on file   Social History Narrative  . No narrative on file    Review of systems: The patient specifically denies any chest pain at rest or with exertion, dyspnea at rest or with exertion, orthopnea, paroxysmal nocturnal dyspnea, syncope, palpitations, focal neurological deficits, intermittent claudication, lower extremity edema, unexplained weight gain, cough, hemoptysis or wheezing.  The patient also denies abdominal pain, nausea, vomiting, dysphagia, diarrhea, constipation, polyuria, polydipsia, dysuria, hematuria, frequency, urgency, abnormal bleeding or bruising, fever, chills, unexpected weight changes, mood swings, change in skin or hair texture, change in voice quality, auditory or visual problems, allergic  reactions or rashes, new musculoskeletal complaints other than usual "aches and pains".   PHYSICAL EXAM BP 148/90  Pulse 57  Resp 16  Ht 5\' 6"  (1.676 m)  Wt 86.682 kg (191 lb 1.6 oz)  BMI 30.86 kg/m2  General: Alert, oriented x3, no distress Head: no evidence of trauma, PERRL, EOMI, no exophtalmos or lid lag, no myxedema, no xanthelasma; normal ears, nose and oropharynx Neck: normal jugular venous pulsations and no hepatojugular reflux; brisk carotid pulses without delay and no carotid bruits Chest: clear to auscultation, no signs of consolidation by percussion or palpation, normal fremitus, symmetrical and full respiratory excursions; sternotomy scar Cardiovascular: normal position and quality of the apical impulse, regular rhythm, normal  first and second heart sounds, no murmurs, rubs or gallops Abdomen: no tenderness or distention, no masses by palpation, no abnormal pulsatility or arterial bruits, normal bowel sounds, no hepatosplenomegaly Extremities: no clubbing, cyanosis or edema; 2+ radial, ulnar and brachial pulses bilaterally; 2+ right femoral, posterior tibial and dorsalis pedis pulses; 2+ left femoral, posterior tibial and dorsalis pedis pulses; no subclavian or femoral bruits Neurological: grossly nonfocal   EKG: Mild sinus bradycardia, otherwise normal  Lipid Panel  Total cholesterol 140, triglycerides 180, HDL 30, LDL 74 (2013)  BMET Potassium 4.3, glucose 101, creatinine 0.74, normal liver function tests except borderline AST of 45, normal thyroid function studies (2013)     ASSESSMENT AND PLAN  CAD (coronary artery disease) s/p CABG x5 2010 Present 10 2010 with a small non-ST segment elevation myocardial infarction and was found to have three-vessel coronary artery disease for which he underwent bypass surgery (LIMA to LAD, sequential SVG to OM1 and OM 2, SVG to distal RCA, SVG to diagonal). No history of wall motion abnormalities or depressed LVEF. No history of arrhythmia or congestive heart failure. Remains asymptomatic/free of angina. This factor modification is the mainstay of treatment.  History of syncope Syncope in May of 2012 presumed to be vasovagal - documented hypotension, no arrhythmia on 24-hour Holter monitor. No recurrence  Obesity (BMI 30-39.9) As he did last year I reviewed with him the importance of regular physical exercise, calorie restriction, avoidance of sugars and starches with high glycemic index and saturated fat and increased intake of lean protein and unsaturated fat   Orders Placed This Encounter  Procedures  . EKG 12-Lead   No orders of the defined types were placed in this encounter.    Junious Silk, MD, [facility] [phone] office [phone] pager

## 2012-11-18 NOTE — Assessment & Plan Note (Signed)
Present 10 2010 with a small non-ST segment elevation myocardial infarction and was found to have three-vessel coronary artery disease for which he underwent bypass surgery (LIMA to LAD, sequential SVG to OM1 and OM 2, SVG to distal RCA, SVG to diagonal). No history of wall motion abnormalities or depressed LVEF. No history of arrhythmia or congestive heart failure. Remains asymptomatic/free of angina. This factor modification is the mainstay of treatment.

## 2012-11-18 NOTE — Assessment & Plan Note (Signed)
As he did last year I reviewed with him the importance of regular physical exercise, calorie restriction, avoidance of sugars and starches with high glycemic index and saturated fat and increased intake of lean protein and unsaturated fat

## 2012-11-18 NOTE — Assessment & Plan Note (Signed)
Syncope in May of 2012 presumed to be vasovagal - documented hypotension, no arrhythmia on 24-hour Holter monitor. No recurrence

## 2012-12-02 ENCOUNTER — Other Ambulatory Visit: Payer: Self-pay | Admitting: Cardiovascular Disease

## 2012-12-03 NOTE — Telephone Encounter (Signed)
Rx was sent to pharmacy electronically. 

## 2012-12-08 ENCOUNTER — Other Ambulatory Visit: Payer: Self-pay | Admitting: Dermatology

## 2013-10-05 ENCOUNTER — Other Ambulatory Visit: Payer: Self-pay | Admitting: Dermatology

## 2013-10-12 ENCOUNTER — Ambulatory Visit: Payer: Medicare Other | Admitting: Cardiovascular Disease

## 2013-11-03 ENCOUNTER — Ambulatory Visit: Payer: Medicare Other | Admitting: Cardiovascular Disease

## 2013-11-12 ENCOUNTER — Encounter: Payer: Self-pay | Admitting: Cardiovascular Disease

## 2013-12-15 ENCOUNTER — Ambulatory Visit: Payer: Medicare Other | Admitting: Cardiovascular Disease

## 2013-12-25 ENCOUNTER — Ambulatory Visit (INDEPENDENT_AMBULATORY_CARE_PROVIDER_SITE_OTHER): Payer: Medicare Other | Admitting: Cardiovascular Disease

## 2013-12-25 ENCOUNTER — Encounter: Payer: Self-pay | Admitting: Cardiovascular Disease

## 2013-12-25 VITALS — BP 150/80 | HR 66 | Resp 16 | Ht 66.0 in | Wt 185.8 lb

## 2013-12-25 DIAGNOSIS — Z87898 Personal history of other specified conditions: Secondary | ICD-10-CM

## 2013-12-25 DIAGNOSIS — Z9189 Other specified personal risk factors, not elsewhere classified: Secondary | ICD-10-CM

## 2013-12-25 DIAGNOSIS — I10 Essential (primary) hypertension: Secondary | ICD-10-CM

## 2013-12-25 DIAGNOSIS — E785 Hyperlipidemia, unspecified: Secondary | ICD-10-CM

## 2013-12-25 DIAGNOSIS — I2510 Atherosclerotic heart disease of native coronary artery without angina pectoris: Secondary | ICD-10-CM

## 2013-12-25 NOTE — Patient Instructions (Signed)
Your physician recommends that you schedule a follow-up appointment in: 1 year  

## 2013-12-26 ENCOUNTER — Other Ambulatory Visit: Payer: Self-pay | Admitting: Cardiovascular Disease

## 2013-12-27 ENCOUNTER — Encounter: Payer: Self-pay | Admitting: Cardiovascular Disease

## 2013-12-27 NOTE — Assessment & Plan Note (Signed)
HDL cholesterol remains low. Likely to improve if he loses more weight. Satisfactory total and LDL cholesterol levels. No changes in medication.

## 2013-12-27 NOTE — Progress Notes (Signed)
Patient ID: Jeff [name], male   DOB: 1946-01-27, 68 y.o.   MRN: [id number]      Reason for office visit CAD s/p CABG  Jeff [name] presented in 2010 with a small non-ST segment elevation myocardial infarction and was found to have three-vessel coronary artery disease for which he underwent bypass surgery (LIMA to LAD, sequential SVG to OM1 and OM 2, SVG to distal RCA, SVG to diagonal). No history of wall motion abnormalities or depressed LVEF. No history of arrhythmia or congestive heart failure. Remains asymptomatic/free of angina. Risk factor modification is the mainstay of treatment. He describes his eating habits and is healthy but his wife, [place], who is also patient complains that he eats too many fatty foods. His lipid profile is quite good. His blood pressure is slightly high today but typically at home is lower.  Had a syncopal event in May of 2012 that had features of vasovagal mechanism. Hypotension was well documented at that time. He subsequently wore a 24-hour Holter monitor that showed no arrhythmia. He has not had any recurrent syncope since.    Allergies  Allergen Reactions  . Zyban [Bupropion Hcl] Hives and Swelling    Current Outpatient Prescriptions  Medication Sig Dispense Refill  . aspirin 81 MG tablet Take 81 mg by mouth daily.        . metoprolol tartrate (LOPRESSOR) 25 MG tablet TAKE 1/2 TABLET BY MOUTH TWICE A DAY  30 tablet  11  . simvastatin (ZOCOR) 40 MG tablet Take 40 mg by mouth at bedtime.         Current Facility-Administered Medications  Medication Dose Route Frequency Provider Last Rate Last Dose  . 0.9 %  sodium chloride infusion  500 mL Intravenous Continuous Lafayette Dragon, MD        Past Medical History  Diagnosis Date  . Hyperlipidemia   . CAD (coronary artery disease)     post CABG x 5 in 07/2008  . Syncope 02/24/10    holter monitor - 24 hrs - normal tracing per [facility]  . Hypertension   . COPD (chronic obstructive pulmonary disease)     Past  Surgical History  Procedure Laterality Date  . Coronary artery bypass graft  07/2008    5 vessels 2010 - Dr. Darcey Nora  . Right shoulder surgery    . Tonsillectomy    . Colonoscopy    . Transthoracic echocardiogram  07/29/2008    see procedures tab  . Cardiac catheterization  07/28/2008    EF 38%, normal systolic function; 25% proximal lesion in LAD, 70% in CIRC, 99% ostial lesion in MARG1, 50% RCA distal lesion and 30% RCA proximal -  recommend CABG    Family History  Problem Relation Age of Onset  . Heart disease Mother   . Heart attack Mother   . Heart attack Maternal Grandmother   . Diabetes Brother   . Diabetes Sister     History   Social History  . Marital Status: Single    Spouse Name: N/A    Number of Children: N/A  . Years of Education: N/A   Occupational History  . Not on file.   Social History Main Topics  . Smoking status: Former Smoker    Quit date: 12/06/2005  . Smokeless tobacco: Not on file  . Alcohol Use: No  . Drug Use: No  . Sexual Activity: Not on file   Other Topics Concern  . Not on file   Social History Narrative  .  No narrative on file    Review of systems: The patient specifically denies any chest pain at rest or with exertion, dyspnea at rest or with exertion, orthopnea, paroxysmal nocturnal dyspnea, syncope, palpitations, focal neurological deficits, intermittent claudication, lower extremity edema, unexplained weight gain, cough, hemoptysis or wheezing.  The patient also denies abdominal pain, nausea, vomiting, dysphagia, diarrhea, constipation, polyuria, polydipsia, dysuria, hematuria, frequency, urgency, abnormal bleeding or bruising, fever, chills, unexpected weight changes, mood swings, change in skin or hair texture, change in voice quality, auditory or visual problems, allergic reactions or rashes, new musculoskeletal complaints other than usual "aches and pains".   PHYSICAL EXAM BP 150/80  Pulse 66  Resp 16  Ht 5\' 6"  (1.676 m)   Wt 185 lb 12.8 oz (84.278 kg)  BMI 30.00 kg/m2  General: Alert, oriented x3, no distress Head: no evidence of trauma, PERRL, EOMI, no exophtalmos or lid lag, no myxedema, no xanthelasma; normal ears, nose and oropharynx Neck: normal jugular venous pulsations and no hepatojugular reflux; brisk carotid pulses without delay and no carotid bruits Chest: clear to auscultation, no signs of consolidation by percussion or palpation, normal fremitus, symmetrical and full respiratory excursions, healed sternotomy scar Cardiovascular: normal position and quality of the apical impulse, regular rhythm, normal first and second heart sounds, no murmurs, rubs or gallops Abdomen: no tenderness or distention, no masses by palpation, no abnormal pulsatility or arterial bruits, normal bowel sounds, no hepatosplenomegaly Extremities: no clubbing, cyanosis or edema; 2+ radial, ulnar and brachial pulses bilaterally; 2+ right femoral, posterior tibial and dorsalis pedis pulses; 2+ left femoral, posterior tibial and dorsalis pedis pulses; no subclavian or femoral bruits Neurological: grossly nonfocal   EKG: Normal sinus rhythm, left axis deviation (unchanged)  Lipid Panel   May 2015 total cholesterol 134, triglycerides 142, HDL 32, LDL 74 Creatinine 0.77, glucose 110, sodium 134, potassium 4.9, hemoglobin 15.7, normal liver function tests    ASSESSMENT AND PLAN CAD (coronary artery disease) s/p CABG x5 2010 Asymptomatic. Encouraged to lose weight and exercise regularly.  History of syncope No recurrence in over 3 years. Probably vasovagal.  Hyperlipidemia HDL cholesterol remains low. Likely to improve if he loses more weight. Satisfactory total and LDL cholesterol levels. No changes in medication.  Essential hypertension Slightly high today. He reports recent readings that were within normal range. No changes made to his medications. Remind about the importance of sodium restriction.   Patient  Instructions  Your physician recommends that you schedule a follow-up appointment in: 1 year    Jeff [name]  Sanda Klein, MD, [facility] [phone] office [phone] pager

## 2013-12-27 NOTE — Assessment & Plan Note (Signed)
Asymptomatic. Encouraged to lose weight and exercise regularly.

## 2013-12-27 NOTE — Assessment & Plan Note (Signed)
Slightly high today. He reports recent readings that were within normal range. No changes made to his medications. Remind about the importance of sodium restriction.

## 2013-12-27 NOTE — Assessment & Plan Note (Signed)
No recurrence in over 3 years. Probably vasovagal.

## 2013-12-28 NOTE — Telephone Encounter (Signed)
Rx refill sent to patient pharmacy   

## 2014-01-07 ENCOUNTER — Other Ambulatory Visit: Payer: Self-pay | Admitting: Internal Medicine

## 2014-01-07 ENCOUNTER — Ambulatory Visit
Admission: RE | Admit: 2014-01-07 | Discharge: 2014-01-07 | Disposition: A | Discharge status: Home / Self Care | Payer: Medicare Other | Source: Ambulatory Visit | Attending: Internal Medicine | Admitting: Internal Medicine

## 2014-01-07 DIAGNOSIS — G5692 Unspecified mononeuropathy of left upper limb: Secondary | ICD-10-CM

## 2014-01-07 DIAGNOSIS — R52 Pain, unspecified: Secondary | ICD-10-CM

## 2014-01-07 DIAGNOSIS — M542 Cervicalgia: Secondary | ICD-10-CM

## 2014-01-08 ENCOUNTER — Ambulatory Visit
Admission: RE | Admit: 2014-01-08 | Discharge: 2014-01-08 | Disposition: A | Discharge status: Home / Self Care | Payer: Medicare Other | Source: Ambulatory Visit | Attending: Internal Medicine | Admitting: Internal Medicine

## 2014-01-08 DIAGNOSIS — M542 Cervicalgia: Secondary | ICD-10-CM

## 2014-04-09 ENCOUNTER — Other Ambulatory Visit: Payer: Self-pay | Admitting: Dermatology

## 2014-09-02 DIAGNOSIS — H2513 Age-related nuclear cataract, bilateral: Secondary | ICD-10-CM | POA: Diagnosis not present

## 2014-10-13 DIAGNOSIS — R972 Elevated prostate specific antigen [PSA]: Secondary | ICD-10-CM | POA: Diagnosis not present

## 2014-10-13 DIAGNOSIS — D559 Anemia due to enzyme disorder, unspecified: Secondary | ICD-10-CM | POA: Diagnosis not present

## 2014-10-13 DIAGNOSIS — L570 Actinic keratosis: Secondary | ICD-10-CM | POA: Diagnosis not present

## 2014-10-13 DIAGNOSIS — E780 Pure hypercholesterolemia: Secondary | ICD-10-CM | POA: Diagnosis not present

## 2014-10-13 DIAGNOSIS — L820 Inflamed seborrheic keratosis: Secondary | ICD-10-CM | POA: Diagnosis not present

## 2014-10-14 DIAGNOSIS — Z0000 Encounter for general adult medical examination without abnormal findings: Secondary | ICD-10-CM | POA: Diagnosis not present

## 2014-10-14 DIAGNOSIS — I119 Hypertensive heart disease without heart failure: Secondary | ICD-10-CM | POA: Diagnosis not present

## 2014-10-14 DIAGNOSIS — I10 Essential (primary) hypertension: Secondary | ICD-10-CM | POA: Diagnosis not present

## 2014-10-14 DIAGNOSIS — Z23 Encounter for immunization: Secondary | ICD-10-CM | POA: Diagnosis not present

## 2014-10-14 DIAGNOSIS — N400 Enlarged prostate without lower urinary tract symptoms: Secondary | ICD-10-CM | POA: Diagnosis not present

## 2014-12-03 ENCOUNTER — Encounter: Payer: Self-pay | Admitting: Cardiovascular Disease

## 2014-12-03 ENCOUNTER — Ambulatory Visit (INDEPENDENT_AMBULATORY_CARE_PROVIDER_SITE_OTHER): Payer: Medicare Other | Admitting: Cardiovascular Disease

## 2014-12-03 VITALS — BP 122/79 | HR 58 | Ht 66.0 in | Wt 180.0 lb

## 2014-12-03 DIAGNOSIS — I10 Essential (primary) hypertension: Secondary | ICD-10-CM

## 2014-12-03 DIAGNOSIS — E785 Hyperlipidemia, unspecified: Secondary | ICD-10-CM

## 2014-12-03 DIAGNOSIS — E669 Obesity, unspecified: Secondary | ICD-10-CM | POA: Diagnosis not present

## 2014-12-03 DIAGNOSIS — I2510 Atherosclerotic heart disease of native coronary artery without angina pectoris: Secondary | ICD-10-CM

## 2014-12-03 DIAGNOSIS — Z9189 Other specified personal risk factors, not elsewhere classified: Secondary | ICD-10-CM

## 2014-12-03 DIAGNOSIS — Z87898 Personal history of other specified conditions: Secondary | ICD-10-CM

## 2014-12-03 NOTE — Patient Instructions (Signed)
Dr Berry recommends that you schedule a follow-up appointment in 1 year. You will receive a reminder letter in the mail two months in advance. If you don't receive a letter, please call our office to schedule the follow-up appointment. 

## 2014-12-03 NOTE — Progress Notes (Signed)
Patient ID: Jeff [name], male   DOB: Dec 09, 1945, 69 y.o.   MRN: [id number]     Cardiology Office Note   Date:  12/04/2014   ID:  Jeff [name], DOB May 13, 1946, MRN [id number]  PCP:  Jeff Peaches, MD  Cardiologist:   Sanda Klein, MD   Chief Complaint  Patient presents with  . Annual Exam    Patient has no complaints.      History of Present Illness: Jeff [name] is a 69 y.o. male who presents for CAD s/p CABG  Jeff [name] presented in 2010 with a small non-ST segment elevation myocardial infarction and was [name] to have three-vessel coronary artery disease for which he underwent bypass surgery (LIMA to LAD, sequential SVG to OM1 and OM 2, SVG to distal RCA, SVG to diagonal). No history of wall motion abnormalities or depressed LVEF. No history of arrhythmia or congestive heart failure. Remains asymptomatic/free of angina. His lipid profile is quite good.  Had a syncopal event in May of 2012 that had features of vasovagal mechanism. Hypotension was well documented at that time. He subsequently wore a 24-hour Holter monitor that showed no arrhythmia. He has not had any recurrent syncope since.   Jeff [name] has no complaints today. He remains borderline obese.   Past Medical History  Diagnosis Date  . Hyperlipidemia   . CAD (coronary artery disease)     post CABG x 5 in 07/2008  . Syncope 02/24/10    holter monitor - 24 hrs - normal tracing per [facility]  . Hypertension   . COPD (chronic obstructive pulmonary disease)     Past Surgical History  Procedure Laterality Date  . Coronary artery bypass graft  07/2008    5 vessels 2010 - Jeff. Darcey Nora  . Right shoulder surgery    . Tonsillectomy    . Colonoscopy    . Transthoracic echocardiogram  07/29/2008    see procedures tab  . Cardiac catheterization  07/28/2008    EF 97%, normal systolic function; 02% proximal lesion in LAD, 70% in CIRC, 99% ostial lesion in MARG1, 50% RCA distal lesion and 30% RCA proximal -  recommend CABG     Current  Outpatient Prescriptions  Medication Sig Dispense Refill  . aspirin 81 MG tablet Take 81 mg by mouth daily.      . metoprolol tartrate (LOPRESSOR) 25 MG tablet TAKE 1/2 TABLET BY MOUTH TWICE A DAY 30 tablet 11  . simvastatin (ZOCOR) 40 MG tablet Take 40 mg by mouth at bedtime.       Current Facility-Administered Medications  Medication Dose Route Frequency Provider Last Rate Last Dose  . 0.9 %  sodium chloride infusion  500 mL Intravenous Continuous Lafayette Dragon, MD        Allergies:   Zyban    Social History:  The patient  reports that he quit smoking about 9 years ago. He does not have any smokeless tobacco history on file. He reports that he does not drink alcohol or use illicit drugs.   Family History:  The patient's family history includes Diabetes in his brother and sister; Heart attack in his maternal grandmother and mother; Heart disease in his mother.    ROS:  Please see the history of present illness.    Otherwise, review of systems positive for none.   All other systems are reviewed and negative.    PHYSICAL EXAM: VS:  BP 122/79 mmHg  Pulse 58  Ht 5\' 6"  (1.676 m)  Wt  180 lb (81.647 kg)  BMI 29.07 kg/m2 , BMI Body mass index is 29.07 kg/(m^2).  General: Alert, oriented x3, no distress Head: no evidence of trauma, PERRL, EOMI, no exophtalmos or lid lag, no myxedema, no xanthelasma; normal ears, nose and oropharynx Neck: normal jugular venous pulsations and no hepatojugular reflux; brisk carotid pulses without delay and no carotid bruits Chest: clear to auscultation, no signs of consolidation by percussion or palpation, normal fremitus, symmetrical and full respiratory excursions Cardiovascular: normal position and quality of the apical impulse, regular rhythm, normal first and second heart sounds, no murmurs, rubs or gallops Abdomen: no tenderness or distention, no masses by palpation, no abnormal pulsatility or arterial bruits, normal bowel sounds, no  hepatosplenomegaly Extremities: no clubbing, cyanosis or edema; 2+ radial, ulnar and brachial pulses bilaterally; 2+ right femoral, posterior tibial and dorsalis pedis pulses; 2+ left femoral, posterior tibial and dorsalis pedis pulses; no subclavian or femoral bruits Neurological: grossly nonfocal Psych: euthymic mood, full affect   EKG:  EKG is ordered today. The ekg ordered today demonstrates  Mild sinus bradycardia, QTC 402 ms   Recent Labs: No results [name] for requested labs within last 365 days.    Lipid Panel  checked recently by Jeff. Nyoka Cowden, results not yet available    Wt Readings from Last 3 Encounters:  12/03/14 180 lb (81.647 kg)  12/25/13 185 lb 12.8 oz (84.278 kg)  10/28/12 191 lb 1.6 oz (86.682 kg)     ASSESSMENT AND PLAN:  CAD (coronary artery disease) s/p CABG x5 2010 Asymptomatic. Encouraged to lose weight and exercise regularly.  History of syncope No recurrence in over 3 years. Probably vasovagal.  Hyperlipidemia HDL cholesterol remains low.  He has lost some weight and it is likely to improve if he loses more weight. Satisfactory total and LDL cholesterol levels. No changes in medication.  Essential hypertension Good control. No changes made to his medications. Remind about the importance of sodium restriction.    Current medicines are reviewed at length with the patient today.  The patient does not have concerns regarding medicines. Labs/ tests ordered today include:  No orders of the defined types were placed in this encounter.      Patient Instructions  Jeff [name] recommends that you schedule a follow-up appointment in 1 year. You will receive a reminder letter in the mail two months in advance. If you don't receive a letter, please call our office to schedule the follow-up appointment.      Mikael Spray, MD  12/04/2014 10:18 AM    Sanda Klein, MD, [facility] [phone] office [phone] pager

## 2014-12-04 ENCOUNTER — Encounter: Payer: Self-pay | Admitting: Cardiovascular Disease

## 2015-01-17 ENCOUNTER — Other Ambulatory Visit: Payer: Self-pay | Admitting: Cardiovascular Disease

## 2015-01-17 NOTE — Telephone Encounter (Signed)
REFILL 

## 2015-04-12 ENCOUNTER — Telehealth: Payer: Self-pay | Admitting: Cardiovascular Disease

## 2015-04-12 NOTE — Telephone Encounter (Signed)
°*  STAT* If patient is at the pharmacy, call can be transferred to refill team.   1. Which medications need to be refilled? (please list name of each medication and dose if known) Metoprolol 25mg   2. Which pharmacy/location (including street and city if local pharmacy) is medication to be sent to?[facility] on [address]   3. Do they need a 30 day or 90 day supply? [place]

## 2015-04-12 NOTE — Telephone Encounter (Signed)
Called [facility] pharmacy and gave a verbal order, since pt had refills left, dispensing 90 day supply with 2 refills. Spoke with Cristie Hem, Pharmacy Tech and Alex verbalized understanding.

## 2015-04-29 DIAGNOSIS — Z23 Encounter for immunization: Secondary | ICD-10-CM | POA: Diagnosis not present

## 2015-06-06 DIAGNOSIS — Z85828 Personal history of other malignant neoplasm of skin: Secondary | ICD-10-CM | POA: Diagnosis not present

## 2015-06-06 DIAGNOSIS — D225 Melanocytic nevi of trunk: Secondary | ICD-10-CM | POA: Diagnosis not present

## 2015-06-06 DIAGNOSIS — L570 Actinic keratosis: Secondary | ICD-10-CM | POA: Diagnosis not present

## 2015-06-06 DIAGNOSIS — L720 Epidermal cyst: Secondary | ICD-10-CM | POA: Diagnosis not present

## 2015-06-06 DIAGNOSIS — D1801 Hemangioma of skin and subcutaneous tissue: Secondary | ICD-10-CM | POA: Diagnosis not present

## 2015-06-21 ENCOUNTER — Encounter: Payer: Self-pay | Admitting: Internal Medicine

## 2015-10-14 DIAGNOSIS — R972 Elevated prostate specific antigen [PSA]: Secondary | ICD-10-CM | POA: Diagnosis not present

## 2015-10-14 DIAGNOSIS — D559 Anemia due to enzyme disorder, unspecified: Secondary | ICD-10-CM | POA: Diagnosis not present

## 2015-10-14 DIAGNOSIS — C4491 Basal cell carcinoma of skin, unspecified: Secondary | ICD-10-CM | POA: Diagnosis not present

## 2015-10-14 DIAGNOSIS — I119 Hypertensive heart disease without heart failure: Secondary | ICD-10-CM | POA: Diagnosis not present

## 2015-10-14 DIAGNOSIS — E7800 Pure hypercholesterolemia, unspecified: Secondary | ICD-10-CM | POA: Diagnosis not present

## 2015-10-14 DIAGNOSIS — Z0000 Encounter for general adult medical examination without abnormal findings: Secondary | ICD-10-CM | POA: Diagnosis not present

## 2015-10-14 DIAGNOSIS — I2510 Atherosclerotic heart disease of native coronary artery without angina pectoris: Secondary | ICD-10-CM | POA: Diagnosis not present

## 2015-11-03 ENCOUNTER — Encounter: Payer: Self-pay | Admitting: Gastroenterology

## 2015-12-01 ENCOUNTER — Ambulatory Visit (INDEPENDENT_AMBULATORY_CARE_PROVIDER_SITE_OTHER): Payer: Medicare Other | Admitting: Cardiovascular Disease

## 2015-12-01 ENCOUNTER — Encounter: Payer: Self-pay | Admitting: Cardiovascular Disease

## 2015-12-01 VITALS — BP 120/80 | HR 61 | Ht 66.0 in | Wt 188.0 lb

## 2015-12-01 DIAGNOSIS — I2510 Atherosclerotic heart disease of native coronary artery without angina pectoris: Secondary | ICD-10-CM | POA: Diagnosis not present

## 2015-12-01 DIAGNOSIS — Z9189 Other specified personal risk factors, not elsewhere classified: Secondary | ICD-10-CM

## 2015-12-01 DIAGNOSIS — E785 Hyperlipidemia, unspecified: Secondary | ICD-10-CM

## 2015-12-01 DIAGNOSIS — Z87898 Personal history of other specified conditions: Secondary | ICD-10-CM

## 2015-12-01 DIAGNOSIS — E669 Obesity, unspecified: Secondary | ICD-10-CM | POA: Diagnosis not present

## 2015-12-01 NOTE — Patient Instructions (Signed)
Dr Sallyanne Kuster recommends that you continue on your current medications as directed. Please refer to the Current Medication list given to you today.  Your physician wants you to follow-up in: 1 year with Dr Sallyanne Kuster You will receive a reminder letter in the mail two months in advance. If you don't receive a letter, please call our office to schedule the follow-up appointment.  If you need a refill on your cardiac medications before your next appointment, please call your pharmacy.

## 2015-12-01 NOTE — Progress Notes (Signed)
Cardiology Office Note    Date:  12/02/2015   ID:  Jeff [name], DOB Sep 16, 1945, MRN [id number]  PCP:  Criselda Peaches, MD  Cardiologist:   Sanda Klein, MD   chief complaint: CAD follow-up.   History of Present Illness:  Jeff [name] is a 70 y.o. male with a history of coronary artery disease. He presented in 2010 with an STEMI and underwent bypass surgery (LIMA to LAD, sequential SVG to OM1-OM 2, SVG to distal RCA, SVG to diagonal) and has not had recurrent coronary problems since that time. He has preserved left ventricular systolic function has not had heart failure or arrhythmia. In 2012 had a syncopal event with features suggestive of vasovagal mechanism. A Holter monitor performed at that time was normal. Syncope has not recurred.  Jeff [name] remains physically active and asymptomatic. He specifically denies angina, dyspnea, syncope, leg edema, claudication or focal neurological deficits. He reports satisfactory results on a recent lipid profile but I don't have that report.  Past Medical History  Diagnosis Date  . Hyperlipidemia   . CAD (coronary artery disease)     post CABG x 5 in 07/2008  . Syncope 02/24/10    holter monitor - 24 hrs - normal tracing per [facility]  . Hypertension   . COPD (chronic obstructive pulmonary disease) [facility])     Past Surgical History  Procedure Laterality Date  . Coronary artery bypass graft  07/2008    5 vessels 2010 - Dr. Darcey Nora  . Right shoulder surgery    . Tonsillectomy    . Colonoscopy    . Transthoracic echocardiogram  07/29/2008    see procedures tab  . Cardiac catheterization  07/28/2008    EF 123456, normal systolic function; 123456 proximal lesion in LAD, 70% in CIRC, 99% ostial lesion in MARG1, 50% RCA distal lesion and 30% RCA proximal -  recommend CABG    Current Medications: Outpatient Prescriptions Prior to Visit  Medication Sig Dispense Refill  . aspirin 81 MG tablet Take 81 mg by mouth daily.      . metoprolol tartrate (LOPRESSOR)  25 MG tablet TAKE 1/2 TABLET BY MOUTH TWICE A DAY 30 tablet 11  . simvastatin (ZOCOR) 40 MG tablet Take 40 mg by mouth at bedtime.       Facility-Administered Medications Prior to Visit  Medication Dose Route Frequency Provider Last Rate Last Dose  . 0.9 %  sodium chloride infusion  500 mL Intravenous Continuous Lafayette Dragon, MD         Allergies:   Zyban   Social History   Social History  . Marital Status: Single    Spouse Name: N/A  . Number of Children: N/A  . Years of Education: N/A   Social History Main Topics  . Smoking status: Former Smoker    Quit date: 12/06/2005  . Smokeless tobacco: None  . Alcohol Use: No  . Drug Use: No  . Sexual Activity: Not Asked   Other Topics Concern  . None   Social History Narrative     Family History:  The patient's family history includes Diabetes in his brother and sister; Heart attack in his maternal grandmother and mother; Heart disease in his mother.   ROS:   Please see the history of present illness.    ROS All other systems reviewed and are negative.   PHYSICAL EXAM:   VS:  BP 120/80 mmHg  Pulse 61  Ht 5\' 6"  (1.676 m)  Wt 85.276  kg (188 lb)  BMI 30.36 kg/m2  SpO2 96%   GEN: Well nourished, well developed, in no acute distress HEENT: normal Neck: no JVD, carotid bruits, or masses Cardiac: RRR; no murmurs, rubs, or gallops,no edema  Respiratory:  clear to auscultation bilaterally, normal work of breathing GI: soft, nontender, nondistended, + BS MS: no deformity or atrophy Skin: warm and dry, no rash Neuro:  Alert and Oriented x 3, Strength and sensation are intact Psych: euthymic mood, full affect  Wt Readings from Last 3 Encounters:  12/01/15 85.276 kg (188 lb)  12/03/14 81.647 kg (180 lb)  12/25/13 84.278 kg (185 lb 12.8 oz)      Studies/Labs Reviewed:   EKG:  EKG is ordered today.  The ekg ordered today demonstrates Normal sinus rhythm, normal tracing    ASSESSMENT:    1. Coronary artery disease  involving native coronary artery of native heart without angina pectoris   2. Hyperlipidemia   3. Obesity (BMI 30-39.9)   4. History of syncope      PLAN:  In order of problems listed above:  1. CAD: Roughly 7 years from bypass surgery he remains asymptomatic despite an active lifestyle. He is on antianginal is a very low dose of beta blocker. Continue risk factor modification as primary treatment 2. HLP: We'll request his most recent lipid profile. Target LDL under 70 mg/deciliter 3. Obesity: No real progress made with weight loss. He remains borderline obese. encouraged to limit intake of carbohydrates and saturated fats and increase intake of lean protein and unsaturated fat. 4. History of syncope: Single episode in 2012 (documented hypotension, presumed vasovagal, negative Holter monitor) without recurrence    Medication Adjustments/Labs and Tests Ordered: Current medicines are reviewed at length with the patient today.  Concerns regarding medicines are outlined above.  Medication changes, Labs and Tests ordered today are listed in the Patient Instructions below. Patient Instructions  Dr Sallyanne Kuster recommends that you continue on your current medications as directed. Please refer to the Current Medication list given to you today.  Your physician wants you to follow-up in: 1 year with Dr Sallyanne Kuster You will receive a reminder letter in the mail two months in advance. If you don't receive a letter, please call our office to schedule the follow-up appointment.  If you need a refill on your cardiac medications before your next appointment, please call your pharmacy.     Signed, Sanda Klein, MD  12/02/2015 8:49 PM    [address], [place], [place]  [address] Phone: [phone]; Fax: [phone]

## 2016-01-05 DIAGNOSIS — H524 Presbyopia: Secondary | ICD-10-CM | POA: Diagnosis not present

## 2016-01-05 DIAGNOSIS — H2513 Age-related nuclear cataract, bilateral: Secondary | ICD-10-CM | POA: Diagnosis not present

## 2016-02-01 DIAGNOSIS — M7542 Impingement syndrome of left shoulder: Secondary | ICD-10-CM | POA: Diagnosis not present

## 2016-03-02 DIAGNOSIS — Z23 Encounter for immunization: Secondary | ICD-10-CM | POA: Diagnosis not present

## 2016-03-14 DIAGNOSIS — L570 Actinic keratosis: Secondary | ICD-10-CM | POA: Diagnosis not present

## 2017-01-07 ENCOUNTER — Encounter: Payer: Self-pay | Admitting: Cardiovascular Disease

## 2017-01-07 ENCOUNTER — Ambulatory Visit (INDEPENDENT_AMBULATORY_CARE_PROVIDER_SITE_OTHER): Payer: Medicare Other | Admitting: Cardiovascular Disease

## 2017-01-07 VITALS — BP 130/79 | HR 51 | Ht 66.0 in | Wt 177.4 lb

## 2017-01-07 DIAGNOSIS — E782 Mixed hyperlipidemia: Secondary | ICD-10-CM

## 2017-01-07 DIAGNOSIS — E663 Overweight: Secondary | ICD-10-CM

## 2017-01-07 DIAGNOSIS — I2510 Atherosclerotic heart disease of native coronary artery without angina pectoris: Secondary | ICD-10-CM

## 2017-01-07 DIAGNOSIS — Z87898 Personal history of other specified conditions: Secondary | ICD-10-CM

## 2017-01-07 NOTE — Patient Instructions (Signed)
Dr Croitoru recommends that you schedule a follow-up appointment in 12 months. You will receive a reminder letter in the mail two months in advance. If you don't receive a letter, please call our office to schedule the follow-up appointment.  If you need a refill on your cardiac medications before your next appointment, please call your pharmacy. 

## 2017-01-07 NOTE — Progress Notes (Signed)
Cardiology Office Note    Date:  01/07/2017   ID:  NASHTON [name], DOB 01-27-1946, MRN [id number]  PCP:  Levin Erp, MD  Cardiologist:   Sanda Klein, MD   chief complaint: CAD follow-up.   History of Present Illness:  Jeff [name] is a 71 y.o. male with a history of coronary artery disease. He presented in 2010 with an STEMI and underwent bypass surgery (LIMA to LAD, sequential SVG to OM1-OM 2, SVG to distal RCA, SVG to diagonal) and has not had recurrent coronary problems since that time. He has preserved left ventricular systolic function has not had heart failure or arrhythmia. In 2012 had a syncopal event with features suggestive of vasovagal mechanism. A Holter monitor performed at that time was normal. Syncope has not recurred.  Jeff [name] feels great. 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 He recently put up a whole carport by himself or one of his nephews. He has not had any episodes of syncope and denies dizziness or palpitations. He does not complain of fatigue, claudication or leg edema or any focal neurological complaints. He recently had a physical with Dr. Nyoka Cowden and reports that his lipid profile was great, but I don't have those results yet. He has a chronically low HDL cholesterol, but a year ago his LDL was excellent.  He has lost about 10 pounds of weight and now fits into 32 inch pants. He is planning to go to the [facility] for exercise.    Past Medical History:  Diagnosis Date  . CAD (coronary artery disease)    post CABG x 5 in 07/2008  . COPD (chronic obstructive pulmonary disease) ([place])   . Hyperlipidemia   . Hypertension   . Syncope 02/24/10   holter monitor - 24 hrs - normal tracing per [facility]    Past Surgical History:  Procedure Laterality Date  . CARDIAC CATHETERIZATION  07/28/2008   EF 79%, normal systolic function; 89% proximal lesion in LAD, 70% in CIRC, 99% ostial lesion in  MARG1, 50% RCA distal lesion and 30% RCA proximal -  recommend CABG  . COLONOSCOPY    . CORONARY ARTERY BYPASS GRAFT  07/2008   5 vessels 2010 - Dr. Darcey Nora  . right shoulder surgery    . TONSILLECTOMY    . TRANSTHORACIC ECHOCARDIOGRAM  07/29/2008   see procedures tab    Current Medications: Outpatient Medications Prior to Visit  Medication Sig Dispense Refill  . aspirin 81 MG tablet Take 81 mg by mouth daily.      . metoprolol tartrate (LOPRESSOR) 25 MG tablet TAKE 1/2 TABLET BY MOUTH TWICE A DAY 30 tablet 11  . simvastatin (ZOCOR) 40 MG tablet Take 40 mg by mouth at bedtime.       Facility-Administered Medications Prior to Visit  Medication Dose Route Frequency Provider Last Rate Last Dose  . 0.9 %  sodium chloride infusion  500 mL Intravenous Continuous Lafayette Dragon, MD         Allergies:   Zyban [bupropion hcl]   Social History   Social History  . Marital status: Single    Spouse name: N/A  . Number of children: N/A  . Years of education: N/A   Social History Main Topics  . Smoking status: Former Smoker    Quit date: 12/06/2005  . Smokeless tobacco: Never Used  . Alcohol use No  . Drug use: No  .  Sexual activity: Not Asked   Other Topics Concern  . None   Social History Narrative  . None     Family History:  The patient's family history includes Diabetes in his brother and sister; Heart attack in his maternal grandmother and mother; Heart disease in his mother.   ROS:   Please see the history of present illness.    ROS All other systems reviewed and are negative.   PHYSICAL EXAM:   VS:  BP 130/79   Pulse (!) 51   Ht 5\' 6"  (1.676 m)   Wt 177 lb 6.4 oz (80.5 kg)   BMI 28.63 kg/m    GEN: Well nourished, well developed, in no acute distress   General: Alert, oriented x3, no distress Head: no evidence of trauma, PERRL, EOMI, no exophtalmos or lid lag, no myxedema, no xanthelasma; normal ears, nose and oropharynx Neck: normal jugular venous pulsations and  no hepatojugular reflux; brisk carotid pulses without delay and no carotid bruits Chest: clear to auscultation, no signs of consolidation by percussion or palpation, normal fremitus, symmetrical and full respiratory excursions. Well-healed sternotomy scar Cardiovascular: normal position and quality of the apical impulse, regular rhythm, normal first and second heart sounds, no murmurs, rubs or gallops Abdomen: no tenderness or distention, no masses by palpation, no abnormal pulsatility or arterial bruits, normal bowel sounds, no hepatosplenomegaly Extremities: no clubbing, cyanosis or edema; 2+ radial, ulnar and brachial pulses bilaterally; 2+ right femoral, posterior tibial and dorsalis pedis pulses; 2+ left femoral, posterior tibial and dorsalis pedis pulses; no subclavian or femoral bruits Neurological: grossly nonfocal  Psych: euthymic mood, full affect  Wt Readings from Last 3 Encounters:  01/07/17 177 lb 6.4 oz (80.5 kg)  12/01/15 188 lb (85.3 kg)  12/03/14 180 lb (81.6 kg)      Studies/Labs Reviewed:   EKG:  EKG is ordered today.  The ekg ordered today demonstrates Sinus bradycardia, otherwise normal. QTC 411 milliseconds    ASSESSMENT:    1. Coronary artery disease involving native coronary artery of native heart without angina pectoris   2. Mixed hyperlipidemia   3. Overweight (BMI 25.0-29.9)   4. History of syncope      PLAN:  In order of problems listed above:  1. CAD: His only antianginal is a very low dose of beta blocker and he is completely asymptomatic and physically active. The focus is on risk factor management. 2. HLP: Had lipid profile checked with Dr. Nyoka Cowden, will request the report. Target LDL under 70 mg/deciliter 3. Overweight: weightheartily congratulated him on his significant weight loss and encouraged him in his plans to join the gym. 4. History of syncope: None since the single episode that he had in 2012 (documented hypotension, presumed vasovagal,  negative Holter monitor)     Medication Adjustments/Labs and Tests Ordered: Current medicines are reviewed at length with the patient today.  Concerns regarding medicines are outlined above.  Medication changes, Labs and Tests ordered today are listed in the Patient Instructions below. Patient Instructions  Dr Sallyanne Kuster recommends that you schedule a follow-up appointment in 12 months. You will receive a reminder letter in the mail two months in advance. If you don't receive a letter, please call our office to schedule the follow-up appointment.  If you need a refill on your cardiac medications before your next appointment, please call your pharmacy.    Signed, Sanda Klein, MD  01/07/2017 8:37 AM    [address],  [place]  [address] Phone: [phone]; Fax: [phone]

## 2017-06-13 ENCOUNTER — Ambulatory Visit: Payer: Medicare Other | Admitting: Podiatry

## 2017-06-13 ENCOUNTER — Telehealth: Payer: Self-pay | Admitting: Podiatry

## 2017-06-13 ENCOUNTER — Encounter: Payer: Self-pay | Admitting: Podiatry

## 2017-06-13 ENCOUNTER — Ambulatory Visit (INDEPENDENT_AMBULATORY_CARE_PROVIDER_SITE_OTHER): Payer: Self-pay

## 2017-06-13 ENCOUNTER — Other Ambulatory Visit: Payer: Self-pay | Admitting: Podiatry

## 2017-06-13 DIAGNOSIS — M79671 Pain in right foot: Secondary | ICD-10-CM | POA: Diagnosis not present

## 2017-06-13 DIAGNOSIS — L84 Corns and callosities: Secondary | ICD-10-CM | POA: Diagnosis not present

## 2017-06-13 DIAGNOSIS — M779 Enthesopathy, unspecified: Secondary | ICD-10-CM

## 2017-06-13 DIAGNOSIS — L600 Ingrowing nail: Secondary | ICD-10-CM

## 2017-06-13 NOTE — Progress Notes (Signed)
   Subjective:    Patient ID: Jeff [name], male    DOB: 12-10-1945, 72 y.o.   MRN: [id number]  HPI    Review of Systems  All other systems reviewed and are negative.      Objective:   Physical Exam        Assessment & Plan:

## 2017-06-13 NOTE — Telephone Encounter (Signed)
I informed pt's wife, Doran Clay that pt should rest, elevated the foot and add more gauze and wear open toed shoes, and he may just have a little more bleeding than pts not on the aspirin, that if his toe was very throbby tonight he could begin the soaks tonight otherwise the soaking begins tomorrow. I told Marylin, if a doctor put pt on the aspirin he needed to continue. Marylin states understanding.

## 2017-06-13 NOTE — Progress Notes (Signed)
Subjective:   Patient ID: Jeff [name], male   DOB: 72 y.o.   MRN: [id number]   HPI Patient presents with painful ingrown toenail deformity hallux left over right and also is noted to have keratotic lesion sub-right heel sub-right metatarsal pain.  Patient states she is tried to trim the nail himself as having increased problems with the left one with the right one still bothering him also and also tries to trim the calluses and has dry skin   Review of Systems  All other systems reviewed and are negative.       Objective:  Physical Exam  Constitutional: He appears well-developed and well-nourished.  Cardiovascular: Intact distal pulses.  Pulmonary/Chest: Effort normal.  Musculoskeletal: Normal range of motion.  Neurological: He is alert.  Skin: Skin is warm.  Nursing note and vitals reviewed.   Neurovascular status intact muscle strength adequate range of motion within normal limits with patient noted to have incurvated nail left hallux medial border mild on the right and is noted to have keratotic lesion right plantar lateral heel and right third metatarsal that are painful when palpated.  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  I do think the left ingrown toenail should be fixed and I explained procedure and risk and patient wants surgery signed consent form understanding all risk of nail surgery.  Today I infiltrated the left hallux 60 mg like Marcaine mixture did sterile prep of the left hallux and using sterile instrumentation remove the medial border exposed matrix and applied phenol 3 applications 30 seconds followed by alcohol lavage and sterile dressing.  Given instructions on soaks and reappoint debrided lesions on the right foot and will  start cream to try to reduce the dry skin present  X-rays indicate that there is moderate depression of the arch and arthritis with no indications of advanced arthritis stress fracture

## 2017-06-13 NOTE — Patient Instructions (Signed)

## 2017-06-13 NOTE — Telephone Encounter (Signed)
I'm calling for my husband. He was seen by Dr. Paulla Dolly today and had an ingrown toenail cut out. I forgot that he is on baby Asprin for a blood thinner. He just took his shoe off and blood has seeped through the bandage and onto his sock. We don't know if this is anything we should be concerned about and should he stop the baby Asprin and for how long as that is the only blood thinner he is on. Please call me back at [phone] I would appreciate it. Thank you.

## 2017-12-10 ENCOUNTER — Other Ambulatory Visit: Payer: Self-pay

## 2017-12-10 ENCOUNTER — Ambulatory Visit (HOSPITAL_COMMUNITY)
Admission: EM | Admit: 2017-12-10 | Discharge: 2017-12-10 | Disposition: A | Discharge status: Home / Self Care | Payer: Medicare Other | Attending: Family Medicine | Admitting: Family Medicine

## 2017-12-10 ENCOUNTER — Ambulatory Visit (INDEPENDENT_AMBULATORY_CARE_PROVIDER_SITE_OTHER): Payer: Medicare Other

## 2017-12-10 ENCOUNTER — Encounter (HOSPITAL_COMMUNITY): Payer: Self-pay | Admitting: Emergency Medicine

## 2017-12-10 DIAGNOSIS — M79604 Pain in right leg: Secondary | ICD-10-CM

## 2017-12-10 DIAGNOSIS — S8254XA Nondisplaced fracture of medial malleolus of right tibia, initial encounter for closed fracture: Secondary | ICD-10-CM

## 2017-12-10 DIAGNOSIS — M25571 Pain in right ankle and joints of right foot: Secondary | ICD-10-CM

## 2017-12-10 NOTE — ED Triage Notes (Signed)
Pt slipped from a six foot ladder and jammed his right foot into the ground.  Pt complains of pain from the foot to his lower leg.

## 2017-12-10 NOTE — Progress Notes (Signed)
Orthopedic Tech Progress Note Patient Details:  Jeff [name] August 22, 1945 [id number]  Ortho Devices Type of Ortho Device: Ace wrap, Post (short leg) splint, Stirrup splint Ortho Device/Splint Location: rle Ortho Device/Splint Interventions: Application   Post Interventions Patient Tolerated: Well Instructions Provided: Care of device   Hildred Priest 12/10/2017, 5:07 PM

## 2017-12-10 NOTE — Discharge Instructions (Signed)
Please rest, ice and elevate the affected extremity. If not allergic, you may take Motrin 600mg every 8 hours or Tylenol 1000mg every 6 hours or as needed for discomfort. Follow up with orthopaedic surgery within one week for further evaluation. Please call for any appointment. Do not remove your splint. You may use a garbage bag while showering to keep your splint dry. Please return here if you are experiencing increased pain, tingling/numbness, swelling, redness, or fever. °

## 2017-12-10 NOTE — ED Provider Notes (Signed)
[facility]   [id number] 12/10/17 Arrival Time: 2229  ASSESSMENT & PLAN:  1. Acute right ankle pain   2. Leg pain, right   3. Closed nondisplaced fracture of medial malleolus of right tibia, initial encounter     Imaging: Dg Ankle Complete Right  Result Date: 12/10/2017 CLINICAL DATA:  Fall from a ladder with ankle and lower leg pain EXAM: RIGHT ANKLE - COMPLETE 3+ VIEW COMPARISON:  06/13/2017 foot radiographs FINDINGS: Soft tissue swelling overlying the lateral malleolus. Nondisplaced oblique fracture of the medial malleolus best appreciated on the oblique projection. 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 Notable tibiotalar spurring with some anterior fragmentation. Dorsal spurring of the talar head. Vascular calcifications noted. Plantar and Achilles calcaneal spurs. IMPRESSION: 1. Oblique fracture of the medial malleolus extending into the distal tibial articular surface. No other definite fractures are identified but there is soft tissue swelling overlying the lateral malleolus. If this is a Weber A fracture pattern (supination-adduction) that it represents stage 2, with underlying lateral collateral ligament tear usually preceding the medial malleolar fracture. 2. Degenerative spurring at the tibiotalar articulation and along the distal talar head. 3. Plantar and Achilles calcaneal spurs. 4. Atherosclerosis. Electronically Signed   By: Van Clines M.D.   On: 12/10/2017 16:29   Prefers OTC analgesics as needed. To arrange orthopaedic follow up within one week. He has already contacted his orthopaedist. May f/u here as needed.  Splint applied by orthopaedic tech. Crutches to remain non-weight bearing.  Reviewed expectations re: course of current medical issues. Questions answered. Outlined signs and symptoms indicating need for more acute intervention. Patient verbalized  understanding. After Visit Summary given.  SUBJECTIVE: History from: patient. Jeff [name] is a 72 y.o. male who reports persistent pain of his right ankle with swelling. Onset abrupt beginning a few hours ago. Injury/trama: yes, reports fall from ladder; landed on ankle. Cannot bear weight. Discomfort described as aching without radiation. Extremity sensation changes or weakness: none. Self treatment: OTC with mild help. Pain worse with any movement.  ROS: As per HPI.   OBJECTIVE:  Vitals:   12/10/17 1518  BP: 131/60  Pulse: 72  Resp: 16  Temp: 98.1 F (36.7 C)  TempSrc: Oral  SpO2: 100%    General appearance: alert; no distress Extremities: no cyanosis or edema; symmetrical with no gross deformities; tenderness over his right ankle; diffuse and poorly localized with marked swelling and mild bruising; ROM: limited by pain CV: normal extremity capillary refill Skin: warm and dry Neurologic: normal gait; normal symmetric reflexes in all extremities; normal sensation in all extremities Psychological: alert and cooperative; normal mood and affect   Allergies  Allergen Reactions  . Zyban [Bupropion Hcl] Hives and Swelling    Past Medical History:  Diagnosis Date  . CAD (coronary artery disease)    post CABG x 5 in 07/2008  . COPD (chronic obstructive pulmonary disease) ([place])   . Hyperlipidemia   . Hypertension   . Syncope 02/24/10   holter monitor - 24 hrs - normal tracing per [facility]   Social History   Socioeconomic History  . Marital status: Single    Spouse name: Not on file  . Number of children: Not on file  . Years of education: Not on file  . Highest education level: Not on file  Occupational History  . Not on file  Social Needs  . Financial resource strain: Not on file  .  Food insecurity:    Worry: Not on file    Inability: Not on file  . Transportation needs:    Medical: Not on file    Non-medical: Not on file  Tobacco Use  . Smoking status: Former  Smoker    Last attempt to quit: 12/06/2005    Years since quitting: 12.0  . Smokeless tobacco: Never Used  Substance and Sexual Activity  . Alcohol use: No  . Drug use: No  . Sexual activity: Not on file  Lifestyle  . Physical activity:    Days per week: Not on file    Minutes per session: Not on file  . Stress: Not on file  Relationships  . Social connections:    Talks on phone: Not on file    Gets together: Not on file    Attends religious service: Not on file    Active member of club or organization: Not on file    Attends meetings of clubs or organizations: Not on file    Relationship status: Not on file  . Intimate partner violence:    Fear of current or ex partner: Not on file    Emotionally abused: Not on file    Physically abused: Not on file    Forced sexual activity: Not on file  Other Topics Concern  . Not on file  Social History Narrative  . Not on file   Family History  Problem Relation Age of Onset  . Heart disease Mother   . Heart attack Mother   . Heart attack Maternal Grandmother   . Diabetes Brother   . Diabetes Sister    Past Surgical History:  Procedure Laterality Date  . CARDIAC CATHETERIZATION  07/28/2008   EF 02%, normal systolic function; 63% proximal lesion in LAD, 70% in CIRC, 99% ostial lesion in MARG1, 50% RCA distal lesion and 30% RCA proximal -  recommend CABG  . COLONOSCOPY    . CORONARY ARTERY BYPASS GRAFT  07/2008   5 vessels 2010 - Dr. Darcey Nora  . right shoulder surgery    . TONSILLECTOMY    . TRANSTHORACIC ECHOCARDIOGRAM  07/29/2008   see procedures tab      Vanessa Kick, MD 12/19/17 1032

## 2017-12-10 NOTE — ED Notes (Signed)
Ortho Tech advised

## 2018-01-23 ENCOUNTER — Ambulatory Visit: Payer: Medicare Other | Admitting: Cardiovascular Disease

## 2018-01-23 ENCOUNTER — Encounter: Payer: Self-pay | Admitting: Cardiovascular Disease

## 2018-01-23 VITALS — BP 132/78 | HR 51 | Ht 66.0 in | Wt 166.0 lb

## 2018-01-23 DIAGNOSIS — E663 Overweight: Secondary | ICD-10-CM

## 2018-01-23 DIAGNOSIS — Z87898 Personal history of other specified conditions: Secondary | ICD-10-CM

## 2018-01-23 DIAGNOSIS — E7800 Pure hypercholesterolemia, unspecified: Secondary | ICD-10-CM

## 2018-01-23 DIAGNOSIS — I25708 Atherosclerosis of coronary artery bypass graft(s), unspecified, with other forms of angina pectoris: Secondary | ICD-10-CM

## 2018-01-23 NOTE — Progress Notes (Signed)
Cardiology Office Note    Date:  01/25/2018   ID:  Jeff [name], DOB June 05, 1945, MRN [id number]  PCP:  Levin Erp, MD  Cardiologist:   Sanda Klein, MD   chief complaint: CAD follow-up.   History of Present Illness:  Jeff [name] is a 72 y.o. male with a history of coronary artery disease. He presented in 2010 with an STEMI and underwent bypass surgery (LIMA to LAD, sequential SVG to OM1-OM 2, SVG to distal RCA, SVG to diagonal) and has not had recurrent coronary problems since that time. He has preserved left ventricular systolic function has not had heart failure or arrhythmia. In 2012 had a syncopal event with features suggestive of vasovagal mechanism. A Holter monitor performed at that time was normal. Syncope has not recurred.  [name] fell off a ladder and fractured his right ankle.  He is wearing an orthopedic boot and is very restless.  His orthopedic surgeon is Dr. Alma Friendly.  His primary care provider is Dr. Nyoka Cowden recently performed labs, including a lipid profile, reportedly all in the desirable range.  Prior to his injury he was still very active and still worked as an Clinical biochemist.  He denies exertional angina, exertional dyspnea, shortness of breath at rest orthopnea, PND, edema, claudication, focal neurological complaints, palpitations or syncope.  He did not feel dizzy before his fall from a ladder.  He has managed to keep off the [facility]  Past Medical History:  Diagnosis Date  . CAD (coronary artery disease)    post CABG x 5 in 07/2008  . COPD (chronic obstructive pulmonary disease) ([place])   . Hyperlipidemia   . Hypertension   . Syncope 02/24/10   holter monitor - 24 hrs - normal tracing per [facility]    Past Surgical History:  Procedure Laterality Date  . CARDIAC CATHETERIZATION  07/28/2008   EF 35%, normal systolic function; 57% proximal lesion in LAD, 70% in CIRC, 99% ostial lesion in MARG1, 50% RCA distal lesion and 30% RCA proximal -   recommend CABG  . COLONOSCOPY    . CORONARY ARTERY BYPASS GRAFT  07/2008   5 vessels 2010 - Dr. Darcey Nora  . right shoulder surgery    . TONSILLECTOMY    . TRANSTHORACIC ECHOCARDIOGRAM  07/29/2008   see procedures tab    Current Medications: Outpatient Medications Prior to Visit  Medication Sig Dispense Refill  . aspirin 81 MG tablet Take 81 mg by mouth daily.      . metoprolol tartrate (LOPRESSOR) 25 MG tablet TAKE 1/2 TABLET BY MOUTH TWICE A DAY 30 tablet 11  . simvastatin (ZOCOR) 40 MG tablet Take 40 mg by mouth at bedtime.       Facility-Administered Medications Prior to Visit  Medication Dose Route Frequency Provider Last Rate Last Dose  . 0.9 %  sodium chloride infusion  500 mL Intravenous Continuous Lafayette Dragon, MD         Allergies:   Zyban [bupropion hcl]   Social History   Socioeconomic History  . Marital status: Single    Spouse name: Not on file  . Number of children: Not on file  . Years of education: Not on file  . Highest education level: Not on file  Occupational History  . Not on file  Social Needs  . Financial resource strain: Not on file  . Food insecurity:    Worry: Not on file    Inability: Not on file  .  Transportation needs:    Medical: Not on file    Non-medical: Not on file  Tobacco Use  . Smoking status: Former Smoker    Last attempt to quit: 12/06/2005    Years since quitting: 12.1  . Smokeless tobacco: Never Used  Substance and Sexual Activity  . Alcohol use: No  . Drug use: No  . Sexual activity: Not on file  Lifestyle  . Physical activity:    Days per week: Not on file    Minutes per session: Not on file  . Stress: Not on file  Relationships  . Social connections:    Talks on phone: Not on file    Gets together: Not on file    Attends religious service: Not on file    Active member of club or organization: Not on file    Attends meetings of clubs or organizations: Not on file    Relationship status: Not on file  Other Topics  Concern  . Not on file  Social History Narrative  . Not on file     Family History:  The patient's family history includes Diabetes in his brother and sister; Heart attack in his maternal grandmother and mother; Heart disease in his mother.   ROS:   Please see the history of present illness.    ROS all other systems are reviewed and are negative  PHYSICAL EXAM:   VS:  BP 132/78   Pulse (!) 51   Ht 5\' 6"  (1.676 m)   Wt 166 lb (75.3 kg) Comment: on homescale this am; wearing boot on rt foot  BMI 26.79 kg/m    GEN: Well nourished, well developed, in no acute distress    General: Alert, oriented x3, no distress, appears lean and fit Head: no evidence of trauma, PERRL, EOMI, no exophtalmos or lid lag, no myxedema, no xanthelasma; normal ears, nose and oropharynx Neck: normal jugular venous pulsations and no hepatojugular reflux; brisk carotid pulses without delay and no carotid bruits Chest: clear to auscultation, no signs of consolidation by percussion or palpation, normal fremitus, symmetrical and full respiratory excursions Cardiovascular: normal position and quality of the apical impulse, regular rhythm, normal first and second heart sounds, no murmurs, rubs or gallops Abdomen: no tenderness or distention, no masses by palpation, no abnormal pulsatility or arterial bruits, normal bowel sounds, no hepatosplenomegaly Extremities: no clubbing, cyanosis or edema; 2+ radial, ulnar and brachial pulses bilaterally; 2+ right femoral, unable to check pedal pulses due to orthopedic device; 2+ left femoral, posterior tibial and dorsalis pedis pulses; no subclavian or femoral bruits Neurological: grossly nonfocal Psych: Normal mood and affect  Wt Readings from Last 3 Encounters:  01/23/18 166 lb (75.3 kg)  01/07/17 177 lb 6.4 oz (80.5 kg)  12/01/15 188 lb (85.3 kg)    Studies/Labs Reviewed:   EKG:  EKG is ordered today.  It shows sinus bradycardia 51 bpm, Q waves in leads III and aVF, no ST  segment -T wave changes, normal QTC 396 ms  Labs from Dr. Nyoka Cowden from Oct 15, 2016 show total cholesterol 123, HDL 32, triglycerides 115, LDL 71 ASSESSMENT:    1. Coronary artery disease of bypass graft of native heart with stable angina pectoris ([place])   2. Hypercholesterolemia   3. Overweight   4. History of syncope      PLAN:  In order of problems listed above:  1. CAD: Currently sedentary, but until recently very active without any angina pectoris.  He only takes a very  low-dose of the beta-blocker as an antianginal medication 2. HLP: Excellent lipid profile last year except for his persistently low HDL cholesterol.  He tells me that labs were even better this year.  Target LDL under 70 mg/deciliter 3. Overweight: I am very proud of him for all the weight that he has lost and kept off.  Congratulated him. 4. History of syncope: None since the single episode that he had in 2012 (documented hypotension, presumed vasovagal, negative Holter monitor)      Medication Adjustments/Labs and Tests Ordered: Current medicines are reviewed at length with the patient today.  Concerns regarding medicines are outlined above.  Medication changes, Labs and Tests ordered today are listed in the Patient Instructions below. Patient Instructions  Dr Sallyanne Kuster recommends that you schedule a follow-up appointment in 12 months. You will receive a reminder letter in the mail two months in advance. If you don't receive a letter, please call our office to schedule the follow-up appointment.  If you need a refill on your cardiac medications before your next appointment, please call your pharmacy.    Signed, Sanda Klein, MD  01/25/2018 4:27 PM    [address], [place], [place]  [address] Phone: [phone]; Fax: [phone]

## 2018-01-23 NOTE — Patient Instructions (Signed)
Dr Croitoru recommends that you schedule a follow-up appointment in 12 months. You will receive a reminder letter in the mail two months in advance. If you don't receive a letter, please call our office to schedule the follow-up appointment.  If you need a refill on your cardiac medications before your next appointment, please call your pharmacy. 

## 2018-01-25 DIAGNOSIS — I25708 Atherosclerosis of coronary artery bypass graft(s), unspecified, with other forms of angina pectoris: Secondary | ICD-10-CM | POA: Insufficient documentation

## 2019-01-28 ENCOUNTER — Other Ambulatory Visit: Payer: Self-pay

## 2019-01-28 ENCOUNTER — Ambulatory Visit (INDEPENDENT_AMBULATORY_CARE_PROVIDER_SITE_OTHER): Payer: Medicare Other | Admitting: Cardiovascular Disease

## 2019-01-28 ENCOUNTER — Encounter: Payer: Self-pay | Admitting: Cardiovascular Disease

## 2019-01-28 VITALS — BP 157/73 | HR 54 | Temp 97.6°F | Ht 66.0 in | Wt 168.6 lb

## 2019-01-28 DIAGNOSIS — E663 Overweight: Secondary | ICD-10-CM | POA: Diagnosis not present

## 2019-01-28 DIAGNOSIS — E785 Hyperlipidemia, unspecified: Secondary | ICD-10-CM | POA: Diagnosis not present

## 2019-01-28 DIAGNOSIS — Z87898 Personal history of other specified conditions: Secondary | ICD-10-CM

## 2019-01-28 DIAGNOSIS — I25708 Atherosclerosis of coronary artery bypass graft(s), unspecified, with other forms of angina pectoris: Secondary | ICD-10-CM | POA: Diagnosis not present

## 2019-01-28 NOTE — Progress Notes (Signed)
Cardiology Office Note    Date:  01/28/2019   ID:  Jeff [name], DOB 09/22/1945, MRN [id number]  PCP:  Levin Erp, MD  Cardiologist:   Sanda Klein, MD   chief complaint: CAD follow-up.   History of Present Illness:  Jeff [name] is a 73 y.o. male with a history of coronary artery disease. He presented in 2010 with an STEMI and underwent bypass surgery (LIMA to LAD, sequential SVG to OM1-OM 2, SVG to distal RCA, SVG to diagonal) and has not had recurrent coronary problems since that time. He has preserved left ventricular systolic function has not had heart failure or arrhythmia. In 2012 had a syncopal event with features suggestive of vasovagal mechanism. A Holter monitor performed at that time was normal. Syncope has not recurred.  He has done well since his last appointment.  He denies any cardiovascular complaints.  He has not had any new falls since his right ankle fracture.  The patient specifically denies any chest pain at rest exertion, dyspnea at rest or with exertion, orthopnea, paroxysmal nocturnal dyspnea, syncope, palpitations, focal neurological deficits, intermittent claudication, lower extremity edema, unexplained weight gain, cough, hemoptysis or wheezing.  A couple of years ago he lost a lot of weight and he is managed to keep it off.  He is minimally overweight with a BMI around 27.  Most recent lipid profile performed Dr. Rolly Salter office showed total cholesterol 123, triglycerides 81, HDL 35, LDL 72.  Past Medical History:  Diagnosis Date  . CAD (coronary artery disease)    post CABG x 5 in 07/2008  . COPD (chronic obstructive pulmonary disease) ([place])   . Hyperlipidemia   . Hypertension   . Syncope 02/24/10   holter monitor - 24 hrs - normal tracing per [facility]    Past Surgical History:  Procedure Laterality Date  . CARDIAC CATHETERIZATION  07/28/2008   EF 123456, normal systolic function; 123456 proximal lesion in LAD, 70% in CIRC, 99% ostial lesion in MARG1, 50%  RCA distal lesion and 30% RCA proximal -  recommend CABG  . COLONOSCOPY    . CORONARY ARTERY BYPASS GRAFT  07/2008   5 vessels 2010 - Dr. Darcey Nora  . right shoulder surgery    . TONSILLECTOMY    . TRANSTHORACIC ECHOCARDIOGRAM  07/29/2008   see procedures tab    Current Medications: Outpatient Medications Prior to Visit  Medication Sig Dispense Refill  . aspirin 81 MG tablet Take 81 mg by mouth daily.      . metoprolol tartrate (LOPRESSOR) 25 MG tablet TAKE 1/2 TABLET BY MOUTH TWICE A DAY 30 tablet 11  . simvastatin (ZOCOR) 40 MG tablet Take 40 mg by mouth at bedtime.       Facility-Administered Medications Prior to Visit  Medication Dose Route Frequency Provider Last Rate Last Dose  . 0.9 %  sodium chloride infusion  500 mL Intravenous Continuous Lafayette Dragon, MD         Allergies:   Zyban [bupropion hcl]   Social History   Socioeconomic History  . Marital status: Single    Spouse name: Not on file  . Number of children: Not on file  . Years of education: Not on file  . Highest education level: Not on file  Occupational History  . Not on file  Social Needs  . Financial resource strain: Not on file  . Food insecurity    Worry: Not on file    Inability: Not on file  .  Transportation needs    Medical: Not on file    Non-medical: Not on file  Tobacco Use  . Smoking status: Former Smoker    Quit date: 12/06/2005    Years since quitting: 13.1  . Smokeless tobacco: Never Used  Substance and Sexual Activity  . Alcohol use: No  . Drug use: No  . Sexual activity: Not on file  Lifestyle  . Physical activity    Days per week: Not on file    Minutes per session: Not on file  . Stress: Not on file  Relationships  . Social Herbalist on phone: Not on file    Gets together: Not on file    Attends religious service: Not on file    Active member of club or organization: Not on file    Attends meetings of clubs or organizations: Not on file    Relationship status:  Not on file  Other Topics Concern  . Not on file  Social History Narrative  . Not on file     Family History:  The patient's family history includes Diabetes in his brother and sister; Heart attack in his maternal grandmother and mother; Heart disease in his mother.   ROS:   Please see the history of present illness.    ROS all other systems are reviewed and are negative  PHYSICAL EXAM:   VS:  BP (!) 157/73   Pulse (!) 54   Temp 97.6 F (36.4 C)   Ht 5\' 6"  (1.676 m)   Wt 168 lb 9.6 oz (76.5 kg)   SpO2 98%   BMI 27.21 kg/m    Recheck blood pressure 130/70 mmHg  General: Alert, oriented x3, no distress, minimally overweight Head: no evidence of trauma, PERRL, EOMI, no exophtalmos or lid lag, no myxedema, no xanthelasma; normal ears, nose and oropharynx Neck: normal jugular venous pulsations and no hepatojugular reflux; brisk carotid pulses without delay and no carotid bruits Chest: clear to auscultation, no signs of consolidation by percussion or palpation, normal fremitus, symmetrical and full respiratory excursions Cardiovascular: normal position and quality of the apical impulse, regular rhythm, normal first and second heart sounds, no murmurs, rubs or gallops Abdomen: no tenderness or distention, no masses by palpation, no abnormal pulsatility or arterial bruits, normal bowel sounds, no hepatosplenomegaly Extremities: no clubbing, cyanosis or edema; 2+ radial, ulnar and brachial pulses bilaterally; 2+ right femoral, posterior tibial and dorsalis pedis pulses; 2+ left femoral, posterior tibial and dorsalis pedis pulses; no subclavian or femoral bruits Neurological: grossly nonfocal Psych: Normal mood and affect   Wt Readings from Last 3 Encounters:  01/28/19 168 lb 9.6 oz (76.5 kg)  01/23/18 166 lb (75.3 kg)  01/07/17 177 lb 6.4 oz (80.5 kg)    Studies/Labs Reviewed:   EKG:  EKG is ordered today.  It shows sinus bradycardia, left axis deviation, questionable old inferior  MI, really no change from previous tracings.  QTc 401 ms  Labs from Dr. Nyoka Cowden total cholesterol 123, triglycerides 81, HDL 35, LDL 72. ASSESSMENT:    No diagnosis found.   PLAN:  In order of problems listed above:  1. CAD: Angina free on a tiny dose of beta-blocker as his only antianginal.  On aspirin and statin. 2. HLP: Persistently low HDL cholesterol level, despite weight loss, but his LDL is very close to the target range.  No changes made to his medications. 3. Overweight: Although he has not lost any additional weight, he has managed to  keep the weight that he lost of considering the current COVID-19 restrictions, he is to be congratulated. 4. History of syncope: None since the single episode that he had in 2012 (documented hypotension, presumed vasovagal, negative Holter monitor)      Medication Adjustments/Labs and Tests Ordered: Current medicines are reviewed at length with the patient today.  Concerns regarding medicines are outlined above.  Medication changes, Labs and Tests ordered today are listed in the Patient Instructions below. There are no Patient Instructions on file for this visit.   Signed, Sanda Klein, MD  01/28/2019 10:17 AM    [facility] [address], [place], [place]  [address] Phone: [phone]; Fax: [phone]

## 2019-01-28 NOTE — Patient Instructions (Signed)
Medication Instructions:  Your physician recommends that you continue on your current medications as directed. Please refer to the Current Medication list given to you today.  If you need a refill on your cardiac medications before your next appointment, please call your pharmacy.   Lab work: NONE If you have labs (blood work) drawn today and your tests are completely normal, you will receive your results only by: [place] (if you have MyChart) OR A paper copy in the mail If you have any lab test that is abnormal or we need to change your treatment, we will call you to review the results.  Testing/Procedures: NONE  Follow-Up: At [facility], you and your health needs are our priority.  As part of our continuing mission to provide you with exceptional heart care, we have created designated Provider Care Teams.  These Care Teams include your primary Cardiologist (physician) and Advanced Practice Providers (APPs -  Physician Assistants and Nurse Practitioners) who all work together to provide you with the care you need, when you need it. You will need a follow up appointment in 12 months.  Please call our office 2 months in advance to schedule this appointment.  You may see Mihai Croitoru or one of the following Engineer, manufacturing Providers on your designated Care Team: Almyra Deforest, PA-C Fabian Sharp, [place]

## 2020-01-04 ENCOUNTER — Telehealth: Payer: Self-pay | Admitting: Adult Health

## 2020-01-04 ENCOUNTER — Other Ambulatory Visit: Payer: Self-pay | Admitting: Adult Health

## 2020-01-04 DIAGNOSIS — U071 COVID-19: Secondary | ICD-10-CM

## 2020-01-04 NOTE — Progress Notes (Signed)
I connected by phone with Jeff [name] on 01/04/2020 at 8:53 PM to discuss the potential use of a new treatment for mild to moderate COVID-19 viral infection in non-hospitalized patients.  This patient is a 74 y.o. male that meets the FDA criteria for Emergency Use Authorization of COVID monoclonal antibody casirivimab/imdevimab.  Has a (+) direct SARS-CoV-2 viral test result  Has mild or moderate COVID-19   Is NOT hospitalized due to COVID-19  Is within 10 days of symptom onset  Has at least one of the high risk factor(s) for progression to severe COVID-19 and/or hospitalization as defined in EUA.  Specific high risk criteria : Older age (>/= 74 yo)   I have spoken and communicated the following to the patient or parent/caregiver regarding COVID monoclonal antibody treatment:  1. FDA has authorized the emergency use for the treatment of mild to moderate COVID-19 in adults and pediatric patients with positive results of direct SARS-CoV-2 viral testing who are 23 years of age and older weighing at least 40 kg, and who are at high risk for progressing to severe COVID-19 and/or hospitalization.  2. The significant known and potential risks and benefits of COVID monoclonal antibody, and the extent to which such potential risks and benefits are unknown.  3. Information on available alternative treatments and the risks and benefits of those alternatives, including clinical trials.  4. Patients treated with COVID monoclonal antibody should continue to self-isolate and use infection control measures (e.g., wear mask, isolate, social distance, avoid sharing personal items, clean and disinfect "high touch" surfaces, and frequent handwashing) according to CDC guidelines.   5. The patient or parent/caregiver has the option to accept or refuse COVID monoclonal antibody treatment.  After reviewing this information with the patient, The patient agreed to proceed with receiving casirivimab\imdevimab  infusion and will be provided a copy of the Fact sheet prior to receiving the infusion. [name] 01/04/2020 8:53 PM

## 2020-01-04 NOTE — Telephone Encounter (Signed)
Called and LMOM regarding monoclonal antibody treatment for COVID 19 given to those who are at risk for complications and/or hospitalization of the virus.  Patient meets criteria based on: age  Call back number given: [phone]  My chart message: unable to send  Vasiliy Mccarry, NP  

## 2020-01-05 MED ORDER — SODIUM CHLORIDE 0.9 % IV SOLN
1200.0000 mg | Freq: Once | INTRAVENOUS | Status: AC
  Administered 2020-01-06: 1200 mg via INTRAVENOUS
  Filled 2020-01-05: qty 1200

## 2020-01-06 ENCOUNTER — Ambulatory Visit (HOSPITAL_COMMUNITY)
Admission: RE | Admit: 2020-01-06 | Discharge: 2020-01-06 | Disposition: A | Discharge status: Home / Self Care | Payer: Medicare Other | Source: Ambulatory Visit | Attending: Pulmonary Disease | Admitting: Pulmonary Disease

## 2020-01-06 DIAGNOSIS — Z23 Encounter for immunization: Secondary | ICD-10-CM | POA: Insufficient documentation

## 2020-01-06 DIAGNOSIS — U071 COVID-19: Secondary | ICD-10-CM | POA: Diagnosis present

## 2020-01-06 DIAGNOSIS — R54 Age-related physical debility: Secondary | ICD-10-CM | POA: Diagnosis not present

## 2020-01-06 MED ORDER — DIPHENHYDRAMINE HCL 50 MG/ML IJ SOLN: 50.0000 mg | Freq: Once | INTRAMUSCULAR | Status: DC | PRN

## 2020-01-06 MED ORDER — SODIUM CHLORIDE 0.9 % IV SOLN: INTRAVENOUS | Status: DC | PRN

## 2020-01-06 MED ORDER — METHYLPREDNISOLONE SODIUM SUCC 125 MG IJ SOLR: 125.0000 mg | Freq: Once | INTRAMUSCULAR | Status: DC | PRN

## 2020-01-06 MED ORDER — ALBUTEROL SULFATE HFA 108 (90 BASE) MCG/ACT IN AERS: 2.0000 | INHALATION_SPRAY | Freq: Once | RESPIRATORY_TRACT | Status: DC | PRN

## 2020-01-06 MED ORDER — FAMOTIDINE IN NACL 20-0.9 MG/50ML-% IV SOLN: 20.0000 mg | Freq: Once | INTRAVENOUS | Status: DC | PRN

## 2020-01-06 MED ORDER — EPINEPHRINE 0.3 MG/0.3ML IJ SOAJ: 0.3000 mg | Freq: Once | INTRAMUSCULAR | Status: DC | PRN

## 2020-01-06 NOTE — Progress Notes (Signed)
  Diagnosis: COVID-19  Physician:Dr Wright  Procedure: [facility] Med: casirivimab\imdevimab infusion - Provided patient with casirivimab\imdevimab fact sheet for patients, parents and caregivers prior to infusion.  Complications: No immediate complications noted.  Discharge: Discharged home   Jeff [name] 01/06/2020  

## 2020-01-06 NOTE — Discharge Instructions (Signed)

## 2020-01-29 ENCOUNTER — Ambulatory Visit: Payer: Medicare Other | Admitting: Cardiovascular Disease

## 2020-02-26 ENCOUNTER — Other Ambulatory Visit: Payer: Self-pay

## 2020-02-26 ENCOUNTER — Ambulatory Visit (INDEPENDENT_AMBULATORY_CARE_PROVIDER_SITE_OTHER): Payer: Medicare Other | Admitting: Cardiovascular Disease

## 2020-02-26 ENCOUNTER — Encounter: Payer: Self-pay | Admitting: Cardiovascular Disease

## 2020-02-26 VITALS — BP 111/69 | HR 59 | Ht 66.0 in | Wt 161.6 lb

## 2020-02-26 DIAGNOSIS — E785 Hyperlipidemia, unspecified: Secondary | ICD-10-CM | POA: Diagnosis not present

## 2020-02-26 DIAGNOSIS — I10 Essential (primary) hypertension: Secondary | ICD-10-CM

## 2020-02-26 DIAGNOSIS — Z87898 Personal history of other specified conditions: Secondary | ICD-10-CM

## 2020-02-26 DIAGNOSIS — I25708 Atherosclerosis of coronary artery bypass graft(s), unspecified, with other forms of angina pectoris: Secondary | ICD-10-CM | POA: Diagnosis not present

## 2020-02-26 NOTE — Progress Notes (Addendum)
Cardiology Office Note    Date:  02/27/2020   ID:  Jeff [name], DOB 1945/06/24, MRN [id number]  PCP:  Sueanne Margarita, DO  Cardiologist:   Sanda Klein, MD   chief complaint: CAD follow-up.   History of Present Illness:  Jeff [name] is a 74 y.o. male with a history of coronary artery disease. He presented in 2010 with an STEMI and underwent bypass surgery (LIMA to LAD, sequential SVG to OM1-OM 2, SVG to distal RCA, SVG to diagonal) and has not had recurrent coronary problems since that time. He has preserved left ventricular systolic function has not had heart failure or arrhythmia. In 2012 had a single syncopal event with features suggestive of vasovagal mechanism (negative Holter monitor).  He had COVID-19 infection in August and received monoclonal antibodies.  The illness was quite mild.  He successfully quit smoking.  He has not put on any weight after quitting smoking.  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  The patient specifically denies any chest pain at rest exertion, dyspnea at rest or with exertion, orthopnea, paroxysmal nocturnal dyspnea, syncope, palpitations, focal neurological deficits, intermittent claudication, lower extremity edema, unexplained weight gain, cough, hemoptysis or wheezing.  Labs performed on December 02, 2019 show total cholesterol 123, HDL 31, LDL 65, triglycerides 134, creatinine 0.9, hemoglobin 16.1.  Past Medical History:  Diagnosis Date  . CAD (coronary artery disease)    post CABG x 5 in 07/2008  . COPD (chronic obstructive pulmonary disease) ([place])   . Hyperlipidemia   . Hypertension   . Syncope 02/24/10   holter monitor - 24 hrs - normal tracing per [facility]    Past Surgical History:  Procedure Laterality Date  . CARDIAC CATHETERIZATION  07/28/2008   EF 16%, normal systolic function; 38% proximal lesion in LAD, 70% in CIRC, 99% ostial lesion in MARG1, 50% RCA distal lesion and 30% RCA proximal -   recommend CABG  . COLONOSCOPY    . CORONARY ARTERY BYPASS GRAFT  07/2008   5 vessels 2010 - Dr. Darcey Nora  . right shoulder surgery    . TONSILLECTOMY    . TRANSTHORACIC ECHOCARDIOGRAM  07/29/2008   see procedures tab    Current Medications: Outpatient Encounter Medications as of 02/26/2020  Medication Sig  . aspirin 81 MG tablet Take 81 mg by mouth daily.    . metoprolol tartrate (LOPRESSOR) 25 MG tablet TAKE 1/2 TABLET BY MOUTH TWICE A DAY  . simvastatin (ZOCOR) 40 MG tablet Take 40 mg by mouth at bedtime.    . hydrochlorothiazide (MICROZIDE) 12.5 MG capsule Take 1 capsule (12.5 mg total) by mouth daily.  Marland Kitchen losartan (COZAAR) 50 MG tablet Take 1 tablet (50 mg total) by mouth daily.   Facility-Administered Encounter Medications as of 02/26/2020  Medication  . 0.9 %  sodium chloride infusion     Allergies:   Zyban [bupropion hcl]   Social History   Socioeconomic History  . Marital status: Married    Spouse name: Not on file  . Number of children: Not on file  . Years of education: Not on file  . Highest education level: Not on file  Occupational History  . Not on file  Tobacco Use  . Smoking status: Former Smoker    Quit date: 12/06/2005    Years since quitting: 14.2  . Smokeless tobacco: Never Used  Substance and Sexual Activity  . Alcohol use: No  . Drug use: No  .  Sexual activity: Not on file  Other Topics Concern  . Not on file  Social History Narrative  . Not on file   Social Determinants of Health   Financial Resource Strain:   . Difficulty of Paying Living Expenses: Not on file  Food Insecurity:   . Worried About Charity fundraiser in the Last Year: Not on file  . Ran Out of Food in the Last Year: Not on file  Transportation Needs:   . Lack of Transportation (Medical): Not on file  . Lack of Transportation (Non-Medical): Not on file  Physical Activity:   . Days of Exercise per Week: Not on file  . Minutes of Exercise per Session: Not on file  Stress:     . Feeling of Stress : Not on file  Social Connections:   . Frequency of Communication with Friends and Family: Not on file  . Frequency of Social Gatherings with Friends and Family: Not on file  . Attends Religious Services: Not on file  . Active Member of Clubs or Organizations: Not on file  . Attends Archivist Meetings: Not on file  . Marital Status: Not on file     Family History:  The patient's family history includes Diabetes in his brother and sister; Heart attack in his maternal grandmother and mother; Heart disease in his mother.   ROS:   Please see the history of present illness.    ROS all other systems are reviewed and are negative  PHYSICAL EXAM:   VS:  BP 111/69   Pulse (!) 59   Ht 5\' 6"  (1.676 m)   Wt 161 lb 9.6 oz (73.3 kg)   SpO2 98%   BMI 26.08 kg/m       General: Alert, oriented x3, no distress, minimally overweight Head: no evidence of trauma, PERRL, EOMI, no exophtalmos or lid lag, no myxedema, no xanthelasma; normal ears, nose and oropharynx Neck: normal jugular venous pulsations and no hepatojugular reflux; brisk carotid pulses without delay and no carotid bruits Chest: clear to auscultation, no signs of consolidation by percussion or palpation, normal fremitus, symmetrical and full respiratory excursions Cardiovascular: normal position and quality of the apical impulse, regular rhythm, normal first and second heart sounds, no murmurs, rubs or gallops Abdomen: no tenderness or distention, no masses by palpation, no abnormal pulsatility or arterial bruits, normal bowel sounds, no hepatosplenomegaly Extremities: no clubbing, cyanosis or edema; 2+ radial, ulnar and brachial pulses bilaterally; 2+ right femoral, posterior tibial and dorsalis pedis pulses; 2+ left femoral, posterior tibial and dorsalis pedis pulses; no subclavian or femoral bruits Neurological: grossly nonfocal Psych: Normal mood and affect    Wt Readings from Last 3 Encounters:   02/26/20 161 lb 9.6 oz (73.3 kg)  01/28/19 168 lb 9.6 oz (76.5 kg)  01/23/18 166 lb (75.3 kg)    Studies/Labs Reviewed:   EKG:  EKG is ordered today.  It shows normal sinus rhythm with minimal voltage criteria for LVH, no repolarization abnormalities.  QTc is 417 ms .lipidp Labs from Dr. Nyoka Cowden December 02, 2019 total cholesterol 123, HDL 31, LDL 65, triglycerides 134  creatinine 0.9, hemoglobin 16.1. ASSESSMENT:    1. Coronary artery disease of bypass graft of native heart with stable angina pectoris ([place])   2. Dyslipidemia (high LDL; low HDL)   3. Essential hypertension   4. History of syncope      PLAN:  In order of problems listed above:  1. CAD: On aspirin statin and low-dose  of beta-blocker, asymptomatic. 2. HLP: Favorable lipid profile except for his stubbornly low HDL cholesterol, despite losing weight. 3. HTN: well controlled. 4. History of syncope: No recurrence. He had a single episode in 2012 (documented hypotension, presumed vasovagal, negative Holter monitor)      Medication Adjustments/Labs and Tests Ordered: Current medicines are reviewed at length with the patient today.  Concerns regarding medicines are outlined above.  Medication changes, Labs and Tests ordered today are listed in the Patient Instructions below. Patient Instructions  Medication Instructions:  No Changes *If you need a refill on your cardiac medications before your next appointment, please call your pharmacy*   Lab Work: None Ordered If you have labs (blood work) drawn today and your tests are completely normal, you will receive your results only by: Marland Kitchen MyChart Message (if you have MyChart) OR . A paper copy in the mail If you have any lab test that is abnormal or we need to change your treatment, we will call you to review the results.   Testing/Procedures: None Ordered   Follow-Up: At [facility], you and your health needs are our priority.  As part of our continuing mission to  provide you with exceptional heart care, we have created designated Provider Care Teams.  These Care Teams include your primary Cardiologist (physician) and Advanced Practice Providers (APPs -  Physician Assistants and Nurse Practitioners) who all work together to provide you with the care you need, when you need it.  We recommend signing up for the patient portal called "MyChart".  Sign up information is provided on this After Visit Summary.  MyChart is used to connect with patients for Virtual Visits (Telemedicine).  Patients are able to view lab/test results, encounter notes, upcoming appointments, etc.  Non-urgent messages can be sent to your provider as well.   To learn more about what you can do with MyChart, go to [email or web address].    Your next appointment:   12 month(s)  The format for your next appointment:   In Person  Provider:   You may see Dr. Sallyanne Kuster or one of the following Advanced Practice Providers on your designated Care Team:    Almyra Deforest, PA-C  Fabian Sharp, [place] or   Roby Lofts, PA-C        Signed, Sanda Klein, MD  02/27/2020 9:16 AM    [address], [place],   [address] Phone: [phone]; Fax: [phone]

## 2020-02-26 NOTE — Patient Instructions (Signed)
Medication Instructions:  No Changes *If you need a refill on your cardiac medications before your next appointment, please call your pharmacy*   Lab Work: None Ordered If you have labs (blood work) drawn today and your tests are completely normal, you will receive your results only by: Marland Kitchen MyChart Message (if you have MyChart) OR . A paper copy in the mail If you have any lab test that is abnormal or we need to change your treatment, we will call you to review the results.   Testing/Procedures: None Ordered   Follow-Up: At [facility], you and your health needs are our priority.  As part of our continuing mission to provide you with exceptional heart care, we have created designated Provider Care Teams.  These Care Teams include your primary Cardiologist (physician) and Advanced Practice Providers (APPs -  Physician Assistants and Nurse Practitioners) who all work together to provide you with the care you need, when you need it.  We recommend signing up for the patient portal called "MyChart".  Sign up information is provided on this After Visit Summary.  MyChart is used to connect with patients for Virtual Visits (Telemedicine).  Patients are able to view lab/test results, encounter notes, upcoming appointments, etc.  Non-urgent messages can be sent to your provider as well.   To learn more about what you can do with MyChart, go to [email or web address].    Your next appointment:   12 month(s)  The format for your next appointment:   In Person  Provider:   You may see Dr. Sallyanne Kuster or one of the following Advanced Practice Providers on your designated Care Team:    Almyra Deforest, PA-C  Fabian Sharp, PA-C or   Roby Lofts, [place]

## 2020-02-27 MED ORDER — LOSARTAN POTASSIUM 50 MG PO TABS: 50.0000 mg | ORAL_TABLET | Freq: Every day | ORAL | 3 refills | Status: DC

## 2020-02-27 MED ORDER — HYDROCHLOROTHIAZIDE 12.5 MG PO CAPS: 12.5000 mg | ORAL_CAPSULE | Freq: Every day | ORAL | 3 refills | Status: DC

## 2020-02-27 NOTE — Addendum Note (Signed)
Addended by: Sanda Klein on: 02/27/2020 09:17 AM   Modules accepted: Orders, Level of Service

## 2020-12-22 ENCOUNTER — Other Ambulatory Visit: Payer: Self-pay | Admitting: Internal Medicine

## 2020-12-22 DIAGNOSIS — Z0000 Encounter for general adult medical examination without abnormal findings: Secondary | ICD-10-CM

## 2020-12-26 ENCOUNTER — Other Ambulatory Visit (HOSPITAL_COMMUNITY): Payer: Self-pay | Admitting: Internal Medicine

## 2020-12-26 DIAGNOSIS — Z136 Encounter for screening for cardiovascular disorders: Secondary | ICD-10-CM

## 2020-12-28 ENCOUNTER — Ambulatory Visit (HOSPITAL_COMMUNITY)
Admission: RE | Admit: 2020-12-28 | Discharge: 2020-12-28 | Disposition: A | Discharge status: Home / Self Care | Payer: Medicare Other | Source: Ambulatory Visit | Attending: Internal Medicine | Admitting: Internal Medicine

## 2020-12-28 ENCOUNTER — Other Ambulatory Visit: Payer: Self-pay

## 2020-12-28 DIAGNOSIS — Z136 Encounter for screening for cardiovascular disorders: Secondary | ICD-10-CM | POA: Diagnosis not present

## 2021-01-04 ENCOUNTER — Encounter: Payer: Self-pay | Admitting: Gastroenterology

## 2021-01-11 ENCOUNTER — Encounter: Payer: Self-pay | Admitting: Gastroenterology

## 2021-01-18 ENCOUNTER — Telehealth: Payer: Self-pay

## 2021-01-18 NOTE — Telephone Encounter (Signed)
John, Please review this patient's ECHO results from 07/2008 and advise on LEC clearance. Thank you

## 2021-01-19 ENCOUNTER — Ambulatory Visit
Admission: RE | Admit: 2021-01-19 | Discharge: 2021-01-19 | Disposition: A | Discharge status: Home / Self Care | Payer: Medicare Other | Source: Ambulatory Visit | Attending: Internal Medicine | Admitting: Internal Medicine

## 2021-01-19 ENCOUNTER — Other Ambulatory Visit: Payer: Self-pay

## 2021-01-19 DIAGNOSIS — Z0000 Encounter for general adult medical examination without abnormal findings: Secondary | ICD-10-CM

## 2021-02-01 ENCOUNTER — Ambulatory Visit (AMBULATORY_SURGERY_CENTER): Payer: Self-pay

## 2021-02-01 ENCOUNTER — Other Ambulatory Visit: Payer: Self-pay

## 2021-02-01 VITALS — Ht 66.0 in | Wt 158.0 lb

## 2021-02-01 DIAGNOSIS — Z1211 Encounter for screening for malignant neoplasm of colon: Secondary | ICD-10-CM

## 2021-02-01 MED ORDER — PEG-KCL-NACL-NASULF-NA ASC-C 100 G PO SOLR: 1.0000 | Freq: Once | ORAL | 0 refills | Status: DC

## 2021-02-01 MED ORDER — PEG-KCL-NACL-NASULF-NA ASC-C 100 G PO SOLR: 1.0000 | Freq: Once | ORAL | 0 refills | Status: AC

## 2021-02-01 NOTE — Progress Notes (Signed)
Denies allergies to eggs or soy products. Denies complication of anesthesia or sedation. Denies use of weight loss medication. Denies use of O2.   Emmi instructions given for colonoscopy.  

## 2021-02-15 ENCOUNTER — Encounter: Payer: Medicare Other | Admitting: Gastroenterology

## 2021-03-13 ENCOUNTER — Encounter: Payer: Medicare Other | Admitting: Gastroenterology

## 2021-04-03 ENCOUNTER — Encounter: Payer: Self-pay | Admitting: Gastroenterology

## 2021-04-03 ENCOUNTER — Ambulatory Visit (AMBULATORY_SURGERY_CENTER): Payer: Medicare Other | Admitting: Gastroenterology

## 2021-04-03 VITALS — BP 113/56 | HR 51 | Temp 98.4°F | Resp 18 | Ht 66.0 in | Wt 158.0 lb

## 2021-04-03 DIAGNOSIS — Z1211 Encounter for screening for malignant neoplasm of colon: Secondary | ICD-10-CM

## 2021-04-03 DIAGNOSIS — D122 Benign neoplasm of ascending colon: Secondary | ICD-10-CM

## 2021-04-03 DIAGNOSIS — D123 Benign neoplasm of transverse colon: Secondary | ICD-10-CM | POA: Diagnosis not present

## 2021-04-03 DIAGNOSIS — D124 Benign neoplasm of descending colon: Secondary | ICD-10-CM

## 2021-04-03 MED ORDER — SODIUM CHLORIDE 0.9 % IV SOLN: 500.0000 mL | Freq: Once | INTRAVENOUS | Status: DC

## 2021-04-03 NOTE — Progress Notes (Signed)
Called to room to assist during endoscopic procedure.  Patient ID and intended procedure confirmed with present staff. Received instructions for my participation in the procedure from the performing physician.  

## 2021-04-03 NOTE — Op Note (Signed)
[place] Patient Name: Jeff [name] Procedure Date: 04/03/2021 2:42 PM MRN: [id number] Endoscopist: Mallie Mussel L. Loletha Carrow , MD Age: 75 Referring MD:  Date of Birth: 12-04-1945 Gender: Male Account #: [id number] Procedure:                Colonoscopy Indications:              Screening for colorectal malignant neoplasm                           no adenomatous polyps last colonoscopy 11/2010 Medicines:                Monitored Anesthesia Care Procedure:                Pre-Anesthesia Assessment:                           - Prior to the procedure, a History and Physical                            was performed, and patient medications and                            allergies were reviewed. The patient's tolerance of                            previous anesthesia was also reviewed. The risks                            and benefits of the procedure and the sedation                            options and risks were discussed with the patient.                            All questions were answered, and informed consent                            was obtained. Prior Anticoagulants: The patient has                            taken no previous anticoagulant or antiplatelet                            agents. ASA Grade Assessment: III - A patient with                            severe systemic disease. After reviewing the risks                            and benefits, the patient was deemed in                            satisfactory condition to undergo the procedure.  After obtaining informed consent, the colonoscope                            was passed under direct vision. Throughout the                            procedure, the patient's blood pressure, pulse, and                            oxygen saturations were monitored continuously. The                            Colonoscope was introduced through the anus and                            advanced to the the  cecum, identified by                            appendiceal orifice and ileocecal valve. The                            colonoscopy was performed without difficulty. The                            patient tolerated the procedure well. The quality                            of the bowel preparation was good. The ileocecal                            valve, appendiceal orifice, and rectum were                            photographed. The bowel preparation used was                            MoviPrep. Scope In: 2:47:03 PM Scope Out: 3:07:36 PM Scope Withdrawal Time: 0 hours 16 minutes 8 seconds  Total Procedure Duration: 0 hours 20 minutes 33 seconds  Findings:                 The perianal and digital rectal examinations were                            normal.                           Five sessile polyps were found in the transverse                            colon and ascending colon. The polyps were 4 to 6                            mm in size. These polyps were removed with a cold  snare. Resection and retrieval were complete.                           A 5 mm polyp was found in the descending colon. The                            polyp was sessile. The polyp was removed with a                            cold snare. Resection and retrieval were complete.                           A few small-mouthed diverticula were found in the                            sigmoid colon.                           The exam was otherwise without abnormality on                            direct and retroflexion views. Complications:            No immediate complications. Estimated Blood Loss:     Estimated blood loss was minimal. Impression:               - Five 4 to 6 mm polyps in the transverse colon and                            in the ascending colon, removed with a cold snare.                            Resected and retrieved.                           - One 5 mm polyp in the  descending colon, removed                            with a cold snare. Resected and retrieved.                           - Diverticulosis in the sigmoid colon.                           - The examination was otherwise normal on direct                            and retroflexion views. Recommendation:           - Patient has a contact number available for                            emergencies. The signs and symptoms of potential  delayed complications were discussed with the                            patient. Return to normal activities tomorrow.                            Written discharge instructions were provided to the                            patient.                           - Resume previous diet.                           - Continue present medications.                           - Await pathology results.                           - Repeat colonoscopy is recommended for                            surveillance. The colonoscopy date will be                            determined after pathology results from today's                            exam become available for review. [name] L. Loletha Carrow, MD 04/03/2021 3:12:39 PM This report has been signed electronically.

## 2021-04-03 NOTE — Progress Notes (Signed)
Pt's states no medical or surgical changes since previsit or office visit.   Check-in-AM  V/S-DT

## 2021-04-03 NOTE — Progress Notes (Signed)
PT taken to PACU. Monitors in place. VSS. Report given to RN. 

## 2021-04-03 NOTE — Patient Instructions (Signed)
Handout on polyps and diverticulosis given    YOU HAD AN ENDOSCOPIC PROCEDURE TODAY AT THE [facility]:   Refer to the procedure report that was given to you for any specific questions about what was found during the examination.  If the procedure report does not answer your questions, please call your gastroenterologist to clarify.  If you requested that your care partner not be given the details of your procedure findings, then the procedure report has been included in a sealed envelope for you to review at your convenience later.  YOU SHOULD EXPECT: Some feelings of bloating in the abdomen. Passage of more gas than usual.  Walking can help get rid of the air that was put into your GI tract during the procedure and reduce the bloating. If you had a lower endoscopy (such as a colonoscopy or flexible sigmoidoscopy) you may notice spotting of blood in your stool or on the toilet paper. If you underwent a bowel prep for your procedure, you may not have a normal bowel movement for a few days.  Please Note:  You might notice some irritation and congestion in your nose or some drainage.  This is from the oxygen used during your procedure.  There is no need for concern and it should clear up in a day or so.  SYMPTOMS TO REPORT IMMEDIATELY:   Following lower endoscopy (colonoscopy or flexible sigmoidoscopy):  Excessive amounts of blood in the stool  Significant tenderness or worsening of abdominal pains  Swelling of the abdomen that is new, acute  Fever of 100F or higher   For urgent or emergent issues, a gastroenterologist can be reached at any hour by calling [phone]. Do not use MyChart messaging for urgent concerns.    DIET:  We do recommend a small meal at first, but then you may proceed to your regular diet.  Drink plenty of fluids but you should avoid alcoholic beverages for 24 hours.  ACTIVITY:  You should plan to take it easy for the rest of today and you should NOT  DRIVE or use heavy machinery until tomorrow (because of the sedation medicines used during the test).    FOLLOW UP: Our staff will call the number listed on your records 48-72 hours following your procedure to check on you and address any questions or concerns that you may have regarding the information given to you following your procedure. If we do not reach you, we will leave a message.  We will attempt to reach you two times.  During this call, we will ask if you have developed any symptoms of COVID 19. If you develop any symptoms (ie: fever, flu-like symptoms, shortness of breath, cough etc.) before then, please call [phone].  If you test positive for Covid 19 in the 2 weeks post procedure, please call and report this information to us.    If any biopsies were taken you will be contacted by phone or by letter within the next 1-3 weeks.  Please call us at [phone] if you have not heard about the biopsies in 3 weeks.    SIGNATURES/CONFIDENTIALITY: You and/or your care partner have signed paperwork which will be entered into your electronic medical record.  These signatures attest to the fact that that the information above on your After Visit Summary has been reviewed and is understood.  Full responsibility of the confidentiality of this discharge information lies with you and/or your care-partner. 

## 2021-04-03 NOTE — Progress Notes (Signed)
History and Physical:  This patient presents for endoscopic testing for: Encounter Diagnosis  Name Primary?   Special screening for malignant neoplasms, colon Yes    Last colonoscopy 11/2010 (hyperplastic polyps) Patient denies chronic abdominal pain, rectal bleeding, constipation or diarrhea.   ROS: Patient denies chest pain or cough   Past Medical History: Past Medical History:  Diagnosis Date   CAD (coronary artery disease)    post CABG x 5 in 07/2008   COPD (chronic obstructive pulmonary disease) ([place])    Hyperlipidemia    Hypertension    Syncope 02/24/10   holter monitor - 24 hrs - normal tracing per [facility]     Past Surgical History: Past Surgical History:  Procedure Laterality Date   CARDIAC CATHETERIZATION  07/28/2008   EF 28%, normal systolic function; 41% proximal lesion in LAD, 70% in CIRC, 99% ostial lesion in MARG1, 50% RCA distal lesion and 30% RCA proximal -  recommend CABG   COLONOSCOPY     CORONARY ARTERY BYPASS GRAFT  07/2008   5 vessels 2010 - Dr. Darcey Nora   right shoulder surgery     TONSILLECTOMY     TRANSTHORACIC ECHOCARDIOGRAM  07/29/2008   see procedures tab    Allergies: Allergies  Allergen Reactions   Zyban [Bupropion Hcl] Hives and Swelling    Outpatient Meds: Current Outpatient Medications  Medication Sig Dispense Refill   aspirin 81 MG tablet Take 81 mg by mouth daily.       hydrochlorothiazide (HYDRODIURIL) 12.5 MG tablet Take 12.5 mg by mouth every morning.     metoprolol tartrate (LOPRESSOR) 25 MG tablet TAKE 1/2 TABLET BY MOUTH TWICE A DAY 30 tablet 11   OVER THE COUNTER MEDICATION Elderberry, vitamin C, Zinc one capsule daily.     simvastatin (ZOCOR) 40 MG tablet Take 40 mg by mouth at bedtime.       hydrochlorothiazide (MICROZIDE) 12.5 MG capsule Take 1 capsule (12.5 mg total) by mouth daily. 90 capsule 3   losartan (COZAAR) 50 MG tablet Take 1 tablet (50 mg total) by mouth daily. 90 tablet 3   Current Facility-Administered Medications   Medication Dose Route Frequency Provider Last Rate Last Admin   0.9 %  sodium chloride infusion  500 mL Intravenous Continuous Lafayette Dragon, MD       0.9 %  sodium chloride infusion  500 mL Intravenous Once Danis, Kirke Corin, MD          ___________________________________________________________________ Objective   Exam:  BP 133/74 (Patient Position: Sitting)   Pulse 70   Temp 98.4 F (36.9 C)   Ht 5\' 6"  (1.676 m)   Wt 158 lb (71.7 kg)   SpO2 97%   BMI 25.50 kg/m   CV: RRR without murmur, S1/S2 Resp: clear to auscultation bilaterally, normal RR and effort noted GI: soft, no tenderness, with active bowel sounds.   Assessment: Encounter Diagnosis  Name Primary?   Special screening for malignant neoplasms, colon Yes     Plan: Colonoscopy  The benefits and risks of the planned procedure were described in detail with the patient or (when appropriate) their health care proxy.  Risks were outlined as including, but not limited to, bleeding, infection, perforation, adverse medication reaction leading to cardiac or pulmonary decompensation, pancreatitis (if ERCP).  The limitation of incomplete mucosal visualization was also discussed.  No guarantees or warranties were given.    The patient is appropriate for an endoscopic procedure in the ambulatory setting.   - Wilfrid Lund,  MD     

## 2021-04-05 ENCOUNTER — Telehealth: Payer: Self-pay

## 2021-04-05 ENCOUNTER — Ambulatory Visit: Payer: Medicare Other | Admitting: Cardiovascular Disease

## 2021-04-05 ENCOUNTER — Other Ambulatory Visit: Payer: Self-pay

## 2021-04-05 ENCOUNTER — Encounter: Payer: Self-pay | Admitting: Cardiovascular Disease

## 2021-04-05 VITALS — BP 118/66 | HR 47 | Resp 20 | Ht 66.0 in | Wt 156.0 lb

## 2021-04-05 DIAGNOSIS — I10 Essential (primary) hypertension: Secondary | ICD-10-CM | POA: Diagnosis not present

## 2021-04-05 DIAGNOSIS — R001 Bradycardia, unspecified: Secondary | ICD-10-CM

## 2021-04-05 DIAGNOSIS — I25708 Atherosclerosis of coronary artery bypass graft(s), unspecified, with other forms of angina pectoris: Secondary | ICD-10-CM

## 2021-04-05 DIAGNOSIS — E785 Hyperlipidemia, unspecified: Secondary | ICD-10-CM | POA: Diagnosis not present

## 2021-04-05 DIAGNOSIS — Z87898 Personal history of other specified conditions: Secondary | ICD-10-CM

## 2021-04-05 NOTE — Progress Notes (Signed)
In 2021 he had COVID-19 infection and he quit smoking.  He has not restarted smoking.  Cardiology Office Note    Date:  04/09/2021   ID:  Jeff [name], DOB [birth year], MRN [id number]  PCP:  Sueanne Margarita, DO  Cardiologist:   Sanda Klein, MD   chief complaint: CAD follow-up.   History of Present Illness:  Jeff [name] is a 75 y.o. male with a history of coronary artery disease. He presented in 2010 with an STEMI and underwent bypass surgery (LIMA to LAD, sequential SVG to OM1-OM 2, SVG to distal RCA, SVG to diagonal) and has not had recurrent coronary problems since that time. He has preserved left ventricular systolic function has not had heart failure or arrhythmia. In 2012 had a single syncopal event with features suggestive of vasovagal mechanism (negative Holter monitor).  In 2021, he successfully quit smoking after COVID-19 infection.  He has not restarted smoking.  The patient specifically denies any chest pain at rest exertion, dyspnea at rest or with exertion, orthopnea, paroxysmal nocturnal dyspnea, syncope, palpitations, focal neurological deficits, intermittent claudication, lower extremity edema, unexplained weight gain, cough, hemoptysis or wheezing.  Most recent LDL cholesterol was 66 in July 2022.  ECG today shows sinus bradycardia at 47 bpm.  He is asymptomatic.  Past Medical History:  Diagnosis Date   CAD (coronary artery disease)    post CABG x 5 in 07/2008   COPD (chronic obstructive pulmonary disease) ([place])    Hyperlipidemia    Hypertension    Syncope 02/24/10   holter monitor - 24 hrs - normal tracing per [facility]    Past Surgical History:  Procedure Laterality Date   CARDIAC CATHETERIZATION  07/28/2008   EF 57%, normal systolic function; 84% proximal lesion in LAD, 70% in CIRC, 99% ostial lesion in MARG1, 50% RCA distal lesion and 30% RCA proximal -  recommend CABG   COLONOSCOPY     CORONARY ARTERY BYPASS GRAFT  07/2008   5 vessels 2010 - Dr. Darcey Nora    right shoulder surgery     TONSILLECTOMY     TRANSTHORACIC ECHOCARDIOGRAM  07/29/2008   see procedures tab    Current Medications: Outpatient Encounter Medications as of 04/05/2021  Medication Sig   aspirin 81 MG tablet Take 81 mg by mouth daily.     hydrochlorothiazide (HYDRODIURIL) 12.5 MG tablet Take 12.5 mg by mouth every morning.   OVER THE COUNTER MEDICATION Elderberry, vitamin C, Zinc one capsule daily.   simvastatin (ZOCOR) 40 MG tablet Take 40 mg by mouth at bedtime.     [DISCONTINUED] metoprolol tartrate (LOPRESSOR) 25 MG tablet TAKE 1/2 TABLET BY MOUTH TWICE A DAY   [DISCONTINUED] hydrochlorothiazide (MICROZIDE) 12.5 MG capsule Take 1 capsule (12.5 mg total) by mouth daily.   [DISCONTINUED] losartan (COZAAR) 50 MG tablet Take 1 tablet (50 mg total) by mouth daily.   Facility-Administered Encounter Medications as of 04/05/2021  Medication   0.9 %  sodium chloride infusion     Allergies:   Zyban [bupropion hcl]   Social History   Socioeconomic History   Marital status: Married    Spouse name: Not on file   Number of children: Not on file   Years of education: Not on file   Highest education level: Not on file  Occupational History   Not on file  Tobacco Use   Smoking status: Former    Types: Cigarettes    Quit date: 12/06/2005    Years since quitting: 59.3  Smokeless tobacco: Never  Substance and Sexual Activity   Alcohol use: No   Drug use: No   Sexual activity: Not on file  Other Topics Concern   Not on file  Social History Narrative   Not on file   Social Determinants of Health   Financial Resource Strain: Not on file  Food Insecurity: Not on file  Transportation Needs: Not on file  Physical Activity: Not on file  Stress: Not on file  Social Connections: Not on file     Family History:  The patient's family history includes Diabetes in his brother and sister; Heart attack in his maternal grandmother and mother; Heart disease in his mother.   ROS:    Please see the history of present illness.    ROS all other systems are reviewed and are negative  PHYSICAL EXAM:   VS:  BP 118/66 (BP Location: Left Arm, Patient Position: Sitting, Cuff Size: Normal)   Pulse (!) 47   Resp 20   Ht 5\' 6"  (1.676 m)   Wt 156 lb (70.8 kg)   SpO2 98%   BMI 25.18 kg/m        General: Alert, oriented x3, no distress, appears healthy and fit Head: no evidence of trauma, PERRL, EOMI, no exophtalmos or lid lag, no myxedema, no xanthelasma; normal ears, nose and oropharynx Neck: normal jugular venous pulsations and no hepatojugular reflux; brisk carotid pulses without delay and no carotid bruits Chest: clear to auscultation, no signs of consolidation by percussion or palpation, normal fremitus, symmetrical and full respiratory excursions Cardiovascular: normal position and quality of the apical impulse, regular rhythm, normal first and second heart sounds, no murmurs, rubs or gallops Abdomen: no tenderness or distention, no masses by palpation, no abnormal pulsatility or arterial bruits, normal bowel sounds, no hepatosplenomegaly Extremities: no clubbing, cyanosis or edema; 2+ radial, ulnar and brachial pulses bilaterally; 2+ right femoral, posterior tibial and dorsalis pedis pulses; 2+ left femoral, posterior tibial and dorsalis pedis pulses; no subclavian or femoral bruits Neurological: grossly nonfocal Psych: Normal mood and affect   Wt Readings from Last 3 Encounters:  04/05/21 156 lb (70.8 kg)  04/03/21 158 lb (71.7 kg)  02/01/21 158 lb (71.7 kg)    Studies/Labs Reviewed:   EKG:  EKG is ordered today.  It shows moderate sinus bradycardia, left axis deviation, otherwise normal, QTC 440 ms Labs from Dr. Nyoka Cowden 12/13/2020 Cholesterol 119, HDL 37, LDL 66, triglycerides 81 ALT 20, TSH 0.68, potassium 5.7 ASSESSMENT:    1. Coronary artery disease of bypass graft of native heart with stable angina pectoris ([place])   2. Sinus bradycardia   3. Dyslipidemia  (high LDL; low HDL)   4. Essential hypertension   5. History of syncope      PLAN:  In order of problems listed above:  CAD: Asymptomatic on aspirin and statin and low-dose beta-blocker. Sinus bradycardia: We will stop his beta-blocker altogether.  We have been steadily reducing the dose due to bradycardia over the years.  He may eventually need a pacemaker. HLP: Excellent LDL, borderline low and HDL. HTN: Very well controlled.  I doubt that stopping the metoprolol will make his blood pressure go too high. History of syncope: No recurrence. He had a single episode in 2012 (documented hypotension, presumed vasovagal, negative Holter monitor)      Medication Adjustments/Labs and Tests Ordered: Current medicines are reviewed at length with the patient today.  Concerns regarding medicines are outlined above.  Medication changes, Labs and Tests  ordered today are listed in the Patient Instructions below. Patient Instructions  Medication Instructions:  STOP the Metoprolol (take it tonight and tomorrow morning and then stop)  *If you need a refill on your cardiac medications before your next appointment, please call your pharmacy*   Lab Work: None ordered If you have labs (blood work) drawn today and your tests are completely normal, you will receive your results only by: [place] (if you have MyChart) OR A paper copy in the mail If you have any lab test that is abnormal or we need to change your treatment, we will call you to review the results.   Testing/Procedures: None ordered   Follow-Up: At [facility], you and your health needs are our priority.  As part of our continuing mission to provide you with exceptional heart care, we have created designated Provider Care Teams.  These Care Teams include your primary Cardiologist (physician) and Advanced Practice Providers (APPs -  Physician Assistants and Nurse Practitioners) who all work together to provide you with the care  you need, when you need it.  We recommend signing up for the patient portal called "MyChart".  Sign up information is provided on this After Visit Summary.  MyChart is used to connect with patients for Virtual Visits (Telemedicine).  Patients are able to view lab/test results, encounter notes, upcoming appointments, etc.  Non-urgent messages can be sent to your provider as well.   To learn more about what you can do with MyChart, go to [email or web address].    Your next appointment:   12 month(s)  The format for your next appointment:   In Person  Provider:   Sanda Klein, MD      Signed, Sanda Klein, MD  04/09/2021 1:57 PM    [facility] [address], [address], [place]  [address] Phone: [phone]; Fax: [phone]

## 2021-04-05 NOTE — Telephone Encounter (Signed)
No answer, left message to call back later today, B.Mishael Krysiak RN. 

## 2021-04-05 NOTE — Patient Instructions (Signed)
Medication Instructions:  STOP the Metoprolol (take it tonight and tomorrow morning and then stop)  *If you need a refill on your cardiac medications before your next appointment, please call your pharmacy*   Lab Work: None ordered If you have labs (blood work) drawn today and your tests are completely normal, you will receive your results only by: [place] (if you have MyChart) OR A paper copy in the mail If you have any lab test that is abnormal or we need to change your treatment, we will call you to review the results.   Testing/Procedures: None ordered   Follow-Up: At [facility], you and your health needs are our priority.  As part of our continuing mission to provide you with exceptional heart care, we have created designated Provider Care Teams.  These Care Teams include your primary Cardiologist (physician) and Advanced Practice Providers (APPs -  Physician Assistants and Nurse Practitioners) who all work together to provide you with the care you need, when you need it.  We recommend signing up for the patient portal called "MyChart".  Sign up information is provided on this After Visit Summary.  MyChart is used to connect with patients for Virtual Visits (Telemedicine).  Patients are able to view lab/test results, encounter notes, upcoming appointments, etc.  Non-urgent messages can be sent to your provider as well.   To learn more about what you can do with MyChart, go to [email or web address].    Your next appointment:   12 month(s)  The format for your next appointment:   In Person  Provider:   Sanda Klein, MD

## 2021-04-07 ENCOUNTER — Encounter: Payer: Self-pay | Admitting: Gastroenterology

## 2021-06-26 ENCOUNTER — Other Ambulatory Visit: Payer: Self-pay | Admitting: Internal Medicine

## 2021-06-26 DIAGNOSIS — Z0000 Encounter for general adult medical examination without abnormal findings: Secondary | ICD-10-CM

## 2021-07-10 ENCOUNTER — Ambulatory Visit: Payer: Medicare Other | Admitting: Podiatry

## 2021-07-10 ENCOUNTER — Other Ambulatory Visit: Payer: Self-pay

## 2021-07-10 DIAGNOSIS — L84 Corns and callosities: Secondary | ICD-10-CM | POA: Diagnosis not present

## 2021-07-10 DIAGNOSIS — L600 Ingrowing nail: Secondary | ICD-10-CM | POA: Diagnosis not present

## 2021-07-10 NOTE — Patient Instructions (Signed)

## 2021-07-10 NOTE — Progress Notes (Signed)
Subjective:   Patient ID: Jeff [name], male   DOB: 76 y.o.   MRN: [id number]   HPI Patient presents stating he has a very painful ingrown toenail of his right big toe and has lesions on the bottom of both feet that become painful and make it hard for him to walk.  He is tried to trim and soak it without relief of symptoms and patient does not smoke likes to be active   Review of Systems  All other systems reviewed and are negative.      Objective:  Physical Exam Vitals and nursing note reviewed.  Constitutional:      Appearance: He is well-developed.  Pulmonary:     Effort: Pulmonary effort is normal.  Musculoskeletal:        General: Normal range of motion.  Skin:    General: Skin is warm.  Neurological:     Mental Status: He is alert.    Neurovascular status intact muscle strength adequate range of motion adequate patient found to have incurvated medial border right hallux with slight distal redness no drainage or other pathology.  Patient has a lesion subfourth metatarsal right subsecond metatarsal left that are keratotic and painful when pressed and has good digital perfusion well oriented     Assessment:  Ingrown toenail deformity right hallux medial border with pain and lesion formation chronic bilateral plantar feet     Plan:  H&P reviewed condition and recommended that he continue conservatively at but I do think the ingrown toenail needs to be corrected and I allowed him to read consent form understanding risk and today infiltrated the right hallux 60 mg like Marcaine mixture sterile prep was done to the toe and using sterile instrumentation I did remove the medial border and exposed the matrix applying phenol 3 applications of 30 seconds followed by alcohol lavage sterile dressing and gave instructions on soaks and to leave dressing on 24 hours but take it off earlier if any throbbing were to occur.  Patient had lesions debrided bilateral no iatrogenic bleeding and did  well with this

## 2021-07-20 ENCOUNTER — Ambulatory Visit: Payer: Medicare Other

## 2022-01-22 ENCOUNTER — Ambulatory Visit
Admission: RE | Admit: 2022-01-22 | Discharge: 2022-01-22 | Disposition: A | Discharge status: Home / Self Care | Payer: Medicare Other | Source: Ambulatory Visit | Attending: Internal Medicine | Admitting: Internal Medicine

## 2022-01-22 DIAGNOSIS — Z0000 Encounter for general adult medical examination without abnormal findings: Secondary | ICD-10-CM

## 2022-04-11 ENCOUNTER — Encounter: Payer: Self-pay | Admitting: Cardiovascular Disease

## 2022-04-11 ENCOUNTER — Ambulatory Visit: Payer: Medicare Other | Attending: Cardiovascular Disease | Admitting: Cardiovascular Disease

## 2022-04-11 VITALS — BP 130/66 | HR 61 | Ht 66.0 in | Wt 156.0 lb

## 2022-04-11 DIAGNOSIS — E785 Hyperlipidemia, unspecified: Secondary | ICD-10-CM | POA: Diagnosis not present

## 2022-04-11 DIAGNOSIS — Z87898 Personal history of other specified conditions: Secondary | ICD-10-CM

## 2022-04-11 DIAGNOSIS — R001 Bradycardia, unspecified: Secondary | ICD-10-CM | POA: Diagnosis not present

## 2022-04-11 DIAGNOSIS — I25810 Atherosclerosis of coronary artery bypass graft(s) without angina pectoris: Secondary | ICD-10-CM

## 2022-04-11 DIAGNOSIS — I10 Essential (primary) hypertension: Secondary | ICD-10-CM | POA: Diagnosis not present

## 2022-04-11 NOTE — Progress Notes (Signed)
In 2021 he had COVID-19 infection and he quit smoking.  He has not restarted smoking.  Cardiology Office Note    Date:  04/11/2022   ID:  Jeff [name], DOB 1946/05/01, MRN [id number]  PCP:  Sueanne Margarita, DO  Cardiologist:   Sanda Klein, MD   chief complaint: CAD follow-up.   History of Present Illness:  Jeff [name] is a 76 y.o. male with a history of coronary artery disease. He presented in 2010 with an STEMI and underwent bypass surgery (LIMA to LAD, sequential SVG to OM1-OM 2, SVG to distal RCA, SVG to diagonal) and has not had recurrent coronary problems since that time. He has preserved left ventricular systolic function has not had heart failure or arrhythmia. In 2012 had a single syncopal event with features suggestive of vasovagal mechanism (negative Holter monitor).  In 2021, he successfully quit smoking after COVID-19 infection.  He has not restarted smoking.  He feels great.  Can do any activity he wants without symptoms.  Takes care of all his own yard work including using a Chiropractor.  The patient specifically denies any chest pain at rest or on exertion, dyspnea at rest or with exertion, orthopnea, paroxysmal nocturnal dyspnea, syncope, palpitations, focal neurological deficits, intermittent claudication, lower extremity edema, unexplained weight gain, cough, hemoptysis or wheezing.  On statin, with his recent LDL cholesterol 57.  Has a chronically borderline low HDL at 39 and a borderline hemoglobin A1c of 5.9%.  Many members of his family have diabetes mellitus.  Past Medical History:  Diagnosis Date   CAD (coronary artery disease)    post CABG x 5 in 07/2008   COPD (chronic obstructive pulmonary disease) ([place])    Hyperlipidemia    Hypertension    Syncope 02/24/10   holter monitor - 24 hrs - normal tracing per [facility]    Past Surgical History:  Procedure Laterality Date   CARDIAC CATHETERIZATION  07/28/2008   EF 20%, normal systolic function; 25%  proximal lesion in LAD, 70% in CIRC, 99% ostial lesion in [place], 50% RCA distal lesion and 30% RCA proximal -  recommend CABG   COLONOSCOPY     CORONARY ARTERY BYPASS GRAFT  07/2008   5 vessels 2010 - Dr. Darcey Nora   right shoulder surgery     TONSILLECTOMY     TRANSTHORACIC ECHOCARDIOGRAM  07/29/2008   see procedures tab    Current Medications: Outpatient Encounter Medications as of 04/11/2022  Medication Sig   aspirin 81 MG tablet Take 81 mg by mouth daily.     finasteride (PROSCAR) 5 MG tablet Take 5 mg by mouth daily.   hydrochlorothiazide (HYDRODIURIL) 12.5 MG tablet Take 12.5 mg by mouth every morning.   losartan (COZAAR) 50 MG tablet Take 50 mg by mouth daily.   OVER THE COUNTER MEDICATION Elderberry, vitamin C, Zinc one capsule daily.   simvastatin (ZOCOR) 40 MG tablet Take 40 mg by mouth at bedtime.     Facility-Administered Encounter Medications as of 04/11/2022  Medication   0.9 %  sodium chloride infusion     Allergies:   Zyban [bupropion hcl]   Social History   Socioeconomic History   Marital status: Married    Spouse name: Not on file   Number of children: Not on file   Years of education: Not on file   Highest education level: Not on file  Occupational History   Not on file  Tobacco Use   Smoking status: Former  Types: Cigarettes    Quit date: 12/06/2005    Years since quitting: 16.3   Smokeless tobacco: Never  Substance and Sexual Activity   Alcohol use: No   Drug use: No   Sexual activity: Not on file  Other Topics Concern   Not on file  Social History Narrative   Not on file   Social Determinants of Health   Financial Resource Strain: Not on file  Food Insecurity: Not on file  Transportation Needs: Not on file  Physical Activity: Not on file  Stress: Not on file  Social Connections: Not on file     Family History:  The patient's family history includes Diabetes in his brother and sister; Heart attack in his maternal grandmother and mother;  Heart disease in his mother.   ROS:   Please see the history of present illness.    ROS all other systems are reviewed and are negative  PHYSICAL EXAM:   VS:  BP 130/66 (BP Location: Left Arm, Patient Position: Sitting, Cuff Size: Normal)   Pulse 61   Ht '5\' 6"'$  (1.676 m)   Wt 70.8 kg   SpO2 96%   BMI 25.18 kg/m       General: Alert, oriented x3, no distress, appears healthy and fit Head: no evidence of trauma, PERRL, EOMI, no exophtalmos or lid lag, no myxedema, no xanthelasma; normal ears, nose and oropharynx Neck: normal jugular venous pulsations and no hepatojugular reflux; brisk carotid pulses without delay and no carotid bruits Chest: clear to auscultation, no signs of consolidation by percussion or palpation, normal fremitus, symmetrical and full respiratory excursions Cardiovascular: normal position and quality of the apical impulse, regular rhythm, normal first and second heart sounds, no murmurs, rubs or gallops Abdomen: no tenderness or distention, no masses by palpation, no abnormal pulsatility or arterial bruits, normal bowel sounds, no hepatosplenomegaly Extremities: no clubbing, cyanosis or edema; 2+ radial, ulnar and brachial pulses bilaterally; 2+ right femoral, posterior tibial and dorsalis pedis pulses; 2+ left femoral, posterior tibial and dorsalis pedis pulses; no subclavian or femoral bruits Neurological: grossly nonfocal Psych: Normal mood and affect    Wt Readings from Last 3 Encounters:  04/11/22 70.8 kg  04/05/21 70.8 kg  04/03/21 71.7 kg    Studies/Labs Reviewed:   EKG:  EKG is ordered today..  Similar to previous tracings if shows normal sinus rhythm and left axis deviation.  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  QTc 410 ms.  BMET    Component Value Date/Time   NA 137 02/03/2010 1409   K 5.3 SLIGHT HEMOLYSIS (H) 02/03/2010 1409   CL 108 02/03/2010 1409   CO2 21  02/03/2010 1409   GLUCOSE 119 (H) 02/03/2010 1409   BUN 11 02/03/2010 1409   CREATININE 1.04 02/03/2010 1409   CALCIUM 9.1 02/03/2010 1409   GFRNONAA >60 02/03/2010 1409   12/26/2021 Potassium 3.9, ALT 15, TSH 0.82, hemoglobin A1c 5.9%  Lipid Panel     Component Value Date/Time   CHOL (H) 07/27/2008 2206    214        ATP III CLASSIFICATION:  <200     mg/dL   Desirable  200-239  mg/dL   Borderline High  >=240    mg/dL   High          TRIG 160 (H) 07/27/2008 2206   HDL 27 (L) 07/27/2008 2206   CHOLHDL 7.9 07/27/2008 2206   VLDL 32  07/27/2008 2206   LDLCALC (H) 07/27/2008 2206    155        Total Cholesterol/HDL:CHD Risk Coronary Heart Disease Risk Table                     Men   Women  1/2 Average Risk   3.4   3.3  Average Risk       5.0   4.4  2 X Average Risk   9.6   7.1  3 X Average Risk  23.4   11.0        Use the calculated Patient Ratio above and the CHD Risk Table to determine the patient's CHD Risk.        ATP III CLASSIFICATION (LDL):  <100     mg/dL   Optimal  100-129  mg/dL   Near or Above                    Optimal  130-159  mg/dL   Borderline  160-189  mg/dL   High  >190     mg/dL   Very High    12/26/2021 Cholesterol 117, HDL 39, LDL 57, triglycerides 107   ASSESSMENT:    1. Coronary artery disease involving coronary bypass graft of native heart without angina pectoris   2. Sinus bradycardia   3. Dyslipidemia (high LDL; low HDL)   4. Essential hypertension   5. History of syncope      PLAN:  In order of problems listed above:  CAD: Asymptomatic.  On aspirin, statin.  Beta-blocker stopped due to bradycardia. Sinus bradycardia: Heart rate not a slow this year.  Had to stop his beta-blocker last year.  May eventually need a pacemaker for sinus node dysfunction but this is not imminent. HLP: Chronically borderline HDL, but excellent LDL cholesterol. HTN: Remains very well controlled. History of syncope: No recurrence. He had a single episode  in 2012 (documented hypotension, presumed vasovagal, negative Holter monitor)     . Medication Adjustments/Labs and Tests Ordered: Current medicines are reviewed at length with the patient today.  Concerns regarding medicines are outlined above.  Medication changes, Labs and Tests ordered today are listed in the Patient Instructions below. Patient Instructions  Medication Instructions:  No Changes In Medications at this time.  *If you need a refill on your cardiac medications before your next appointment, please call your pharmacy*  Lab Work: None Ordered At This Time.  If you have labs (blood work) drawn today and your tests are completely normal, you will receive your results only by: [place] (if you have MyChart) OR A paper copy in the mail If you have any lab test that is abnormal or we need to change your treatment, we will call you to review the results.  Testing/Procedures: None Ordered At This Time.   Follow-Up: At [facility], you and your health needs are our priority.  As part of our continuing mission to provide you with exceptional heart care, we have created designated Provider Care Teams.  These Care Teams include your primary Cardiologist (physician) and Advanced Practice Providers (APPs -  Physician Assistants and Nurse Practitioners) who all work together to provide you with the care you need, when you need it.  Your next appointment:   1 year(s)  The format for your next appointment:   In Person  Provider:   Sanda Klein, MD            Signed, Dani Gobble  Carrisa Keller, MD  04/11/2022 11:24 AM    [address], [place], [place]  [address] Phone: [phone]; Fax: [phone]

## 2022-04-11 NOTE — Patient Instructions (Signed)
Medication Instructions:  No Changes In Medications at this time.  *If you need a refill on your cardiac medications before your next appointment, please call your pharmacy*  Lab Work: None Ordered At This Time.  If you have labs (blood work) drawn today and your tests are completely normal, you will receive your results only by: MyChart Message (if you have MyChart) OR A paper copy in the mail If you have any lab test that is abnormal or we need to change your treatment, we will call you to review the results.  Testing/Procedures: None Ordered At This Time.   Follow-Up: At [facility], you and your health needs are our priority.  As part of our continuing mission to provide you with exceptional heart care, we have created designated Provider Care Teams.  These Care Teams include your primary Cardiologist (physician) and Advanced Practice Providers (APPs -  Physician Assistants and Nurse Practitioners) who all work together to provide you with the care you need, when you need it.  Your next appointment:   1 year(s)  The format for your next appointment:   In Person  Provider:   Mihai Croitoru, MD           

## 2022-07-10 ENCOUNTER — Other Ambulatory Visit: Payer: Self-pay | Admitting: Internal Medicine

## 2022-07-10 DIAGNOSIS — F17200 Nicotine dependence, unspecified, uncomplicated: Secondary | ICD-10-CM

## 2022-11-02 ENCOUNTER — Other Ambulatory Visit: Payer: Self-pay | Admitting: Registered Nurse

## 2022-11-02 DIAGNOSIS — R221 Localized swelling, mass and lump, neck: Secondary | ICD-10-CM

## 2022-11-06 ENCOUNTER — Ambulatory Visit
Admission: RE | Admit: 2022-11-06 | Discharge: 2022-11-06 | Disposition: A | Discharge status: Home / Self Care | Payer: Medicare Other | Source: Ambulatory Visit | Attending: Registered Nurse | Admitting: Registered Nurse

## 2022-11-06 DIAGNOSIS — R221 Localized swelling, mass and lump, neck: Secondary | ICD-10-CM

## 2022-11-06 MED ORDER — IOPAMIDOL (ISOVUE-370) INJECTION 76%
75.0000 mL | Freq: Once | INTRAVENOUS | Status: AC | PRN
  Administered 2022-11-06: 75 mL via INTRAVENOUS

## 2023-01-21 IMAGING — CT CT CHEST LUNG CANCER SCREENING LOW DOSE W/O CM
1 of 2 series · 10 of 20 positions shown, 13 images · non-contrast
Comparison: None.

CLINICAL DATA: Current smoker with 40 pack-year history

EXAM:
CT CHEST WITHOUT CONTRAST LOW-DOSE FOR LUNG CANCER SCREENING
TECHNIQUE: Multidetector CT imaging of the chest was performed following the
standard protocol without IV contrast.

[ct lung segmentation data · axial · 0.71mm/px · z∈[-369,-369]mm · 10 of 347 frames shown]
[frame 1/347  mediastinal]
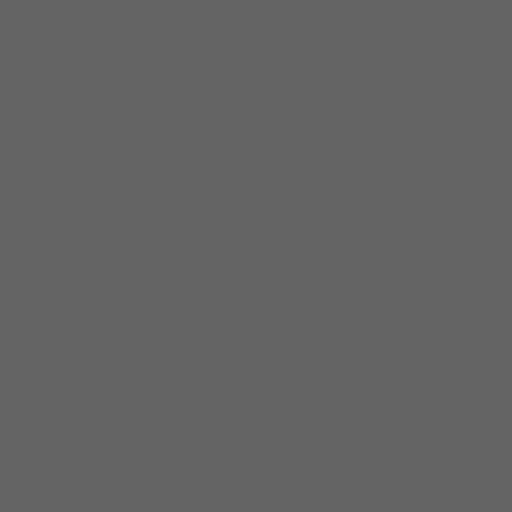
[frame 1/347  lung]
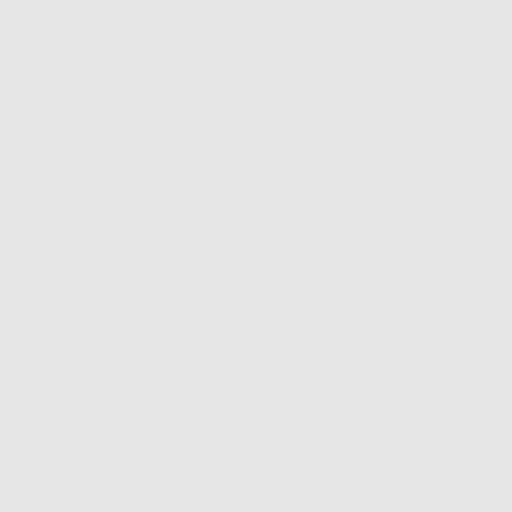
[frame 39/347  lung]
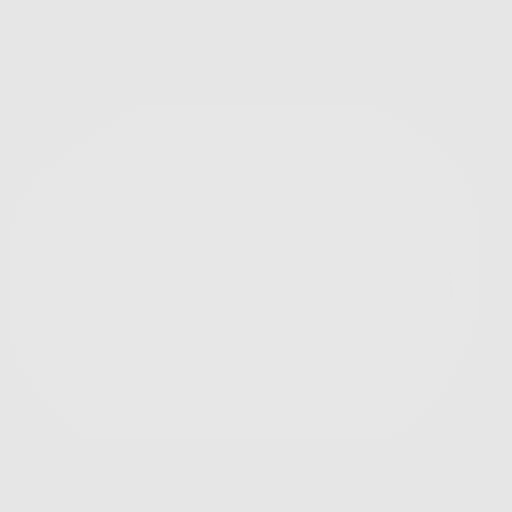
[frame 77/347  lung]
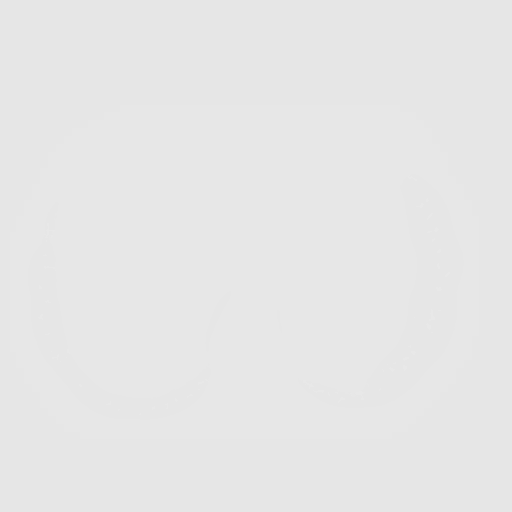
[frame 116/347  lung]
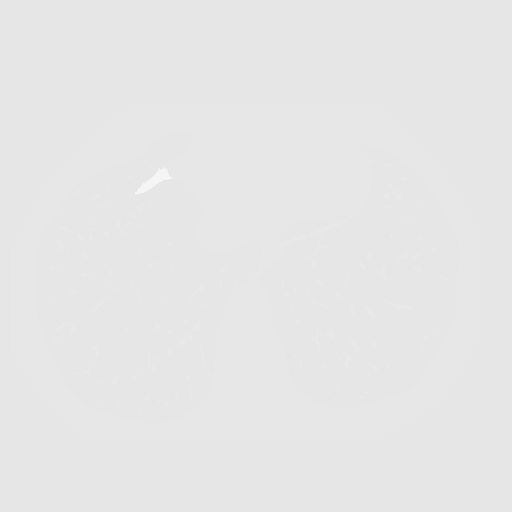
[frame 154/347  mediastinal]
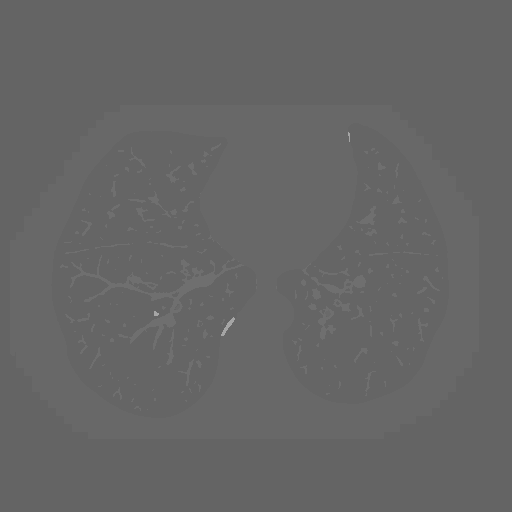
[frame 154/347  lung]
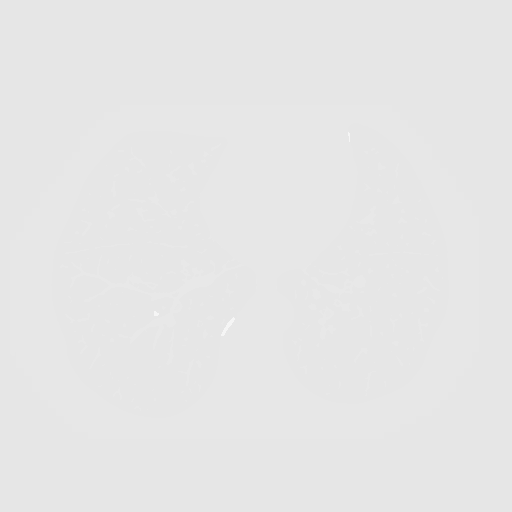
[frame 193/347  lung]
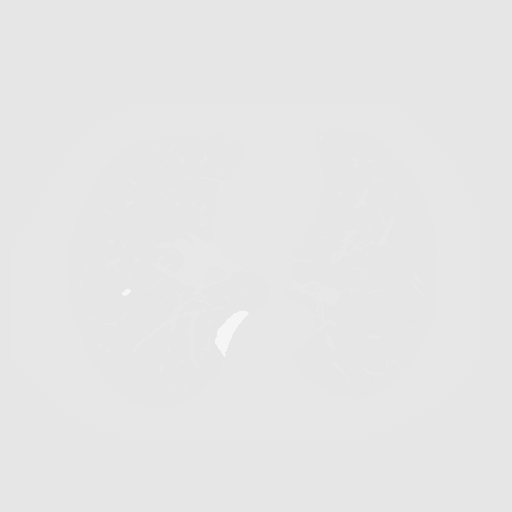
[frame 231/347  lung]
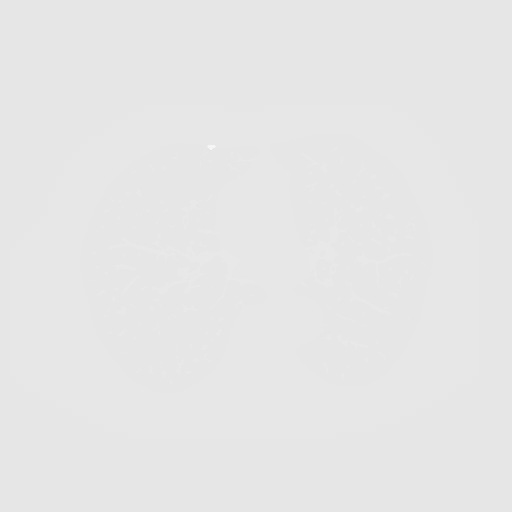
[frame 270/347  lung]
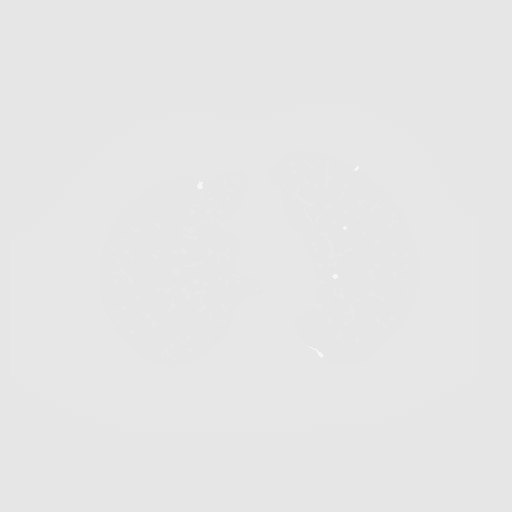
[frame 308/347  mediastinal]
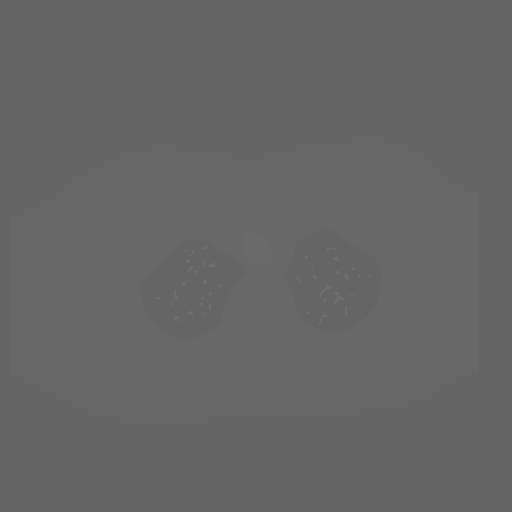
[frame 308/347  lung]
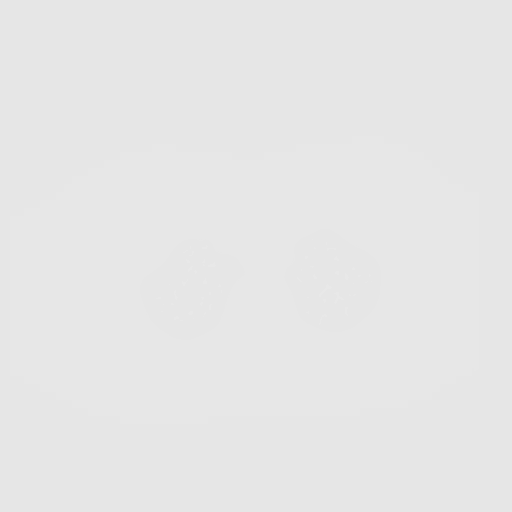
[frame 347/347  lung]
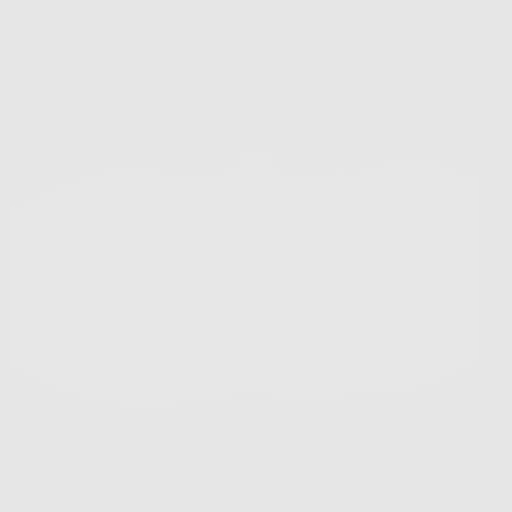

[10 of 20 positions shown; findings below may reference images not displayed]

FINDINGS: Cardiovascular: Normal heart size. Coronary artery calcifications
status post CABG. Atherosclerotic disease of the thoracic aorta.

Mediastinum/Nodes: No pathologically enlarged lymph nodes seen in
the chest. Esophagus and thyroid are unremarkable.

Lungs/Pleura: Central airways are patent. Mild upper lobe
predominant centrilobular emphysema. No consolidation, pleural
effusion or pneumothorax. Small solid nodule of the left upper lobe
measuring 2 mm on series 5, image 80. Small ground-glass nodule of
the right lower lobe measuring 6 mm on series 5 image 42.

Upper Abdomen: Renal vascular calcifications.  No acute findings.

Musculoskeletal: Prior median sternotomy with intact sternal wires.
No aggressive osseous lesions.
IMPRESSION: Lung-RADS 2, benign appearance or behavior. Continue annual
screening with low-dose chest CT without contrast in 12 months.

Aortic Atherosclerosis (PEQEC-0LX.X) and Emphysema (PEQEC-XRF.W).

## 2023-01-25 ENCOUNTER — Ambulatory Visit: Admission: RE | Admit: 2023-01-25 | Payer: Medicare Other | Source: Ambulatory Visit

## 2023-01-25 DIAGNOSIS — F17200 Nicotine dependence, unspecified, uncomplicated: Secondary | ICD-10-CM

## 2023-08-06 ENCOUNTER — Ambulatory Visit: Payer: Medicare Other | Attending: Cardiovascular Disease | Admitting: Cardiovascular Disease

## 2023-08-06 ENCOUNTER — Encounter: Payer: Self-pay | Admitting: Cardiovascular Disease

## 2023-08-06 VITALS — BP 155/65 | HR 47 | Ht 66.0 in | Wt 151.0 lb

## 2023-08-06 DIAGNOSIS — I25810 Atherosclerosis of coronary artery bypass graft(s) without angina pectoris: Secondary | ICD-10-CM | POA: Diagnosis not present

## 2023-08-06 DIAGNOSIS — I10 Essential (primary) hypertension: Secondary | ICD-10-CM

## 2023-08-06 DIAGNOSIS — Z87898 Personal history of other specified conditions: Secondary | ICD-10-CM

## 2023-08-06 DIAGNOSIS — E7800 Pure hypercholesterolemia, unspecified: Secondary | ICD-10-CM

## 2023-08-06 DIAGNOSIS — R001 Bradycardia, unspecified: Secondary | ICD-10-CM

## 2023-08-06 MED ORDER — LOSARTAN POTASSIUM 100 MG PO TABS: 100.0000 mg | ORAL_TABLET | Freq: Every day | ORAL | 3 refills | Status: AC

## 2023-08-06 NOTE — Patient Instructions (Signed)
 Medication Instructions:  Your physician has recommended you make the following change in your medication: INCREASE: Losartan 100 mg once daily CHANGE: Metoprolol 12.5 mg for 4 days than stop  *If you need a refill on your cardiac medications before your next appointment, please call your pharmacy*   Follow-Up: At [facility], you and your health needs are our priority.  As part of our continuing mission to provide you with exceptional heart care, we have created designated Provider Care Teams.  These Care Teams include your primary Cardiologist (physician) and Advanced Practice Providers (APPs -  Physician Assistants and Nurse Practitioners) who all work together to provide you with the care you need, when you need it.   Your next appointment:   1 year(s)  Provider:   Thurmon Fair, MD

## 2023-08-11 ENCOUNTER — Encounter: Payer: Self-pay | Admitting: Cardiovascular Disease

## 2023-08-11 DIAGNOSIS — R001 Bradycardia, unspecified: Secondary | ICD-10-CM | POA: Insufficient documentation

## 2023-08-11 NOTE — Progress Notes (Signed)
 Cardiology Office Note    Date:  08/11/2023   ID:  FILIBERTO WAMBLE, DOB 1945-12-11, MRN [id number]  PCP:  Charlane Ferretti, DO  Cardiologist:   Thurmon Fair, MD   chief complaint: CAD follow-up.   History of Present Illness:  Jeff [name] is a 78 y.o. male with a history of coronary artery disease. He presented in 2010 with an STEMI and underwent bypass surgery (LIMA to LAD, sequential SVG to OM1-OM 2, SVG to distal RCA, SVG to diagonal) and has not had recurrent coronary problems since that time. He has preserved left ventricular systolic function has not had heart failure or arrhythmia. In 2012 had a single syncopal event with features suggestive of vasovagal mechanism (negative Holter monitor).  His wife Jeff [name] is also my patient.  They are both here today accompanied by their son, Jeff [name].  As always, Jeff [name] has no complaints.  He remains very active for his age.  Over the summer he continue to use his push mower and weed eater without complaints of chest pain or shortness of breath.  He is a little less active in the winter months.  Denies palpitations, dizziness, syncope.  Heart rate today is 47 bpm, but he has not noticed any problems.  His blood pressure is elevated today, but he has a very broad pulse pressure and is bradycardic.  In 2021, he successfully quit smoking after COVID-19 infection.  He has not restarted smoking.  He has lost about 5 pounds since last year.  Metabolic control is excellent with LDL cholesterol of 68.  There have been improvements compared to last year: His HDL is up to 42 and hemoglobin A1c of 5.6% is now out of prediabetes range.  He has normal triglycerides and normal renal function.  Past Medical History:  Diagnosis Date   CAD (coronary artery disease)    post CABG x 5 in 07/2008   COPD (chronic obstructive pulmonary disease) (HCC)    Hyperlipidemia    Hypertension    Syncope 02/24/10   holter monitor - 24 hrs - normal tracing per [facility]    Past  Surgical History:  Procedure Laterality Date   CARDIAC CATHETERIZATION  07/28/2008   EF 65%, normal systolic function; 99% proximal lesion in LAD, 70% in CIRC, 99% ostial lesion in MARG1, 50% RCA distal lesion and 30% RCA proximal -  recommend CABG   COLONOSCOPY     CORONARY ARTERY BYPASS GRAFT  07/2008   5 vessels 2010 - Dr. Maren Beach   right shoulder surgery     TONSILLECTOMY     TRANSTHORACIC ECHOCARDIOGRAM  07/29/2008   see procedures tab    Current Medications: Outpatient Encounter Medications as of 08/06/2023  Medication Sig   aspirin 81 MG tablet Take 81 mg by mouth daily.     finasteride (PROSCAR) 5 MG tablet Take 5 mg by mouth daily.   hydrochlorothiazide (HYDRODIURIL) 12.5 MG tablet Take 12.5 mg by mouth every morning.   losartan (COZAAR) 100 MG tablet Take 1 tablet (100 mg total) by mouth daily.   metoprolol succinate (TOPROL-XL) 25 MG 24 hr tablet Take 25 mg by mouth daily.   OVER THE COUNTER MEDICATION Elderberry, vitamin C, Zinc one capsule daily.   simvastatin (ZOCOR) 40 MG tablet Take 40 mg by mouth at bedtime.     [DISCONTINUED] losartan (COZAAR) 50 MG tablet Take 50 mg by mouth daily.   Facility-Administered Encounter Medications as of 08/06/2023  Medication   0.9 %  sodium chloride infusion  Allergies:   Zyban [bupropion hcl]   Family History:  The patient's family history includes Diabetes in his brother and sister; Heart attack in his maternal grandmother and mother; Heart disease in his mother.    PHYSICAL EXAM:   VS:  BP (!) 155/65   Pulse (!) 47   Ht 5\' 6"  (1.676 m)   Wt 151 lb (68.5 kg)   SpO2 95%   BMI 24.37 kg/m       General: Alert, oriented x3, no distress, appears healthy and fit Head: no evidence of trauma, PERRL, EOMI, no exophtalmos or lid lag, no myxedema, no xanthelasma; normal ears, nose and oropharynx Neck: normal jugular venous pulsations and no hepatojugular reflux; brisk carotid pulses without delay and no carotid bruits Chest:  clear to auscultation, no signs of consolidation by percussion or palpation, normal fremitus, symmetrical and full respiratory excursions Cardiovascular: normal position and quality of the apical impulse, regular rhythm, normal first and second heart sounds, no murmurs, rubs or gallops Abdomen: no tenderness or distention, no masses by palpation, no abnormal pulsatility or arterial bruits, normal bowel sounds, no hepatosplenomegaly Extremities: no clubbing, cyanosis or edema; 2+ radial, ulnar and brachial pulses bilaterally; 2+ right femoral, posterior tibial and dorsalis pedis pulses; 2+ left femoral, posterior tibial and dorsalis pedis pulses; no subclavian or femoral bruits Neurological: grossly nonfocal Psych: Normal mood and affect   HYPERTENSION CONTROL Vitals:   08/06/23 1401 08/11/23 1436 08/11/23 1437  BP: (!) 154/80 (!) 155/65 (!) 155/65    The patient's blood pressure is elevated above target today.  In order to address the patient's elevated BP: A current anti-hypertensive medication was adjusted today.       Wt Readings from Last 3 Encounters:  08/06/23 151 lb (68.5 kg)  04/11/22 156 lb (70.8 kg)  04/05/21 156 lb (70.8 kg)    Studies/Labs Reviewed:   EKG:    EKG Interpretation Date/Time:  Tuesday August 06 2023 14:06:53 EDT Ventricular Rate:  47 PR Interval:  194 QRS Duration:  86 QT Interval:  440 QTC Calculation: 389 R Axis:   -31  Text Interpretation: Sinus bradycardia Left axis deviation Low voltage QRS When compared with ECG of 07-Aug-2008 05:29, Vent. rate has decreased BY  48 BPM QRS axis Shifted left T wave inversion less evident in Anterior leads QT has shortened Confirmed by Alwilda Gilland ([id number]) on 08/06/2023 2:30:48 PM         BMET    Component Value Date/Time   NA 137 02/03/2010 1409   K 5.3 SLIGHT HEMOLYSIS (H) 02/03/2010 1409   CL 108 02/03/2010 1409   CO2 21 02/03/2010 1409   GLUCOSE 119 (H) 02/03/2010 1409   BUN 11 02/03/2010 1409    CREATININE 1.04 02/03/2010 1409   CALCIUM 9.1 02/03/2010 1409   GFRNONAA >60 02/03/2010 1409   12/26/2021 Potassium 3.9, ALT 15, TSH 0.82, hemoglobin A1c 5.9%  Lipid Panel     Component Value Date/Time   CHOL (H) 07/27/2008 2206    214        ATP III CLASSIFICATION:  <200     mg/dL   Desirable  782-956  mg/dL   Borderline High  >=213    mg/dL   High          TRIG 086 (H) 07/27/2008 2206   HDL 27 (L) 07/27/2008 2206   CHOLHDL 7.9 07/27/2008 2206   VLDL 32 07/27/2008 2206   LDLCALC (H) 07/27/2008 2206    155  Total Cholesterol/HDL:CHD Risk Coronary Heart Disease Risk Table                     Men   Women  1/2 Average Risk   3.4   3.3  Average Risk       5.0   4.4  2 X Average Risk   9.6   7.1  3 X Average Risk  23.4   11.0        Use the calculated Patient Ratio above and the CHD Risk Table to determine the patient's CHD Risk.        ATP III CLASSIFICATION (LDL):  <100     mg/dL   Optimal  244-010  mg/dL   Near or Above                    Optimal  130-159  mg/dL   Borderline  272-536  mg/dL   High  >644     mg/dL   Very High    03/47/4259 Cholesterol 117, HDL 39, LDL 57, triglycerides 107  01/11/2023  cholesterol 125, HDL 42, LDL 68, triglycerides 76  56/38/7564 Hemoglobin A1c 5.6%   ASSESSMENT:    1. Coronary artery disease involving coronary bypass graft of native heart without angina pectoris   2. Sinus bradycardia   3. Essential hypertension   4. Hypercholesterolemia   5. History of syncope      PLAN:  In order of problems listed above:  CAD: Remains asymptomatic.  He is on aspirin and statin.  We have previously stopped his beta-blocker due to bradycardia but he is back on metoprolol succinate 12.5 mg daily.  I think this should be discontinued. Sinus bradycardia: He might eventually need a pacemaker, but as long as he is asymptomatic this is not imminent. HLP: Held cholesterol in target range and improved HDL cholesterol.  No longer has  "prediabetes". HTN: Systolic blood pressure is elevated, but rather low diastolic.  This may be partly due to bradycardia.  We will wean him off the beta-blocker and increase the losartan to 100 mg daily. History of syncope: No recurrence in over 10 years. He had a single episode in 2012 (documented hypotension, presumed vasovagal, negative Holter monitor)     . Medication Adjustments/Labs and Tests Ordered: Current medicines are reviewed at length with the patient today.  Concerns regarding medicines are outlined above.  Medication changes, Labs and Tests ordered today are listed in the Patient Instructions below. Patient Instructions  Medication Instructions:  Your physician has recommended you make the following change in your medication: INCREASE: Losartan 100 mg once daily CHANGE: Metoprolol 12.5 mg for 4 days than stop  *If you need a refill on your cardiac medications before your next appointment, please call your pharmacy*   Follow-Up: At [facility], you and your health needs are our priority.  As part of our continuing mission to provide you with exceptional heart care, we have created designated Provider Care Teams.  These Care Teams include your primary Cardiologist (physician) and Advanced Practice Providers (APPs -  Physician Assistants and Nurse Practitioners) who all work together to provide you with the care you need, when you need it.   Your next appointment:   1 year(s)  Provider:   Thurmon Fair, MD         Signed, Thurmon Fair, MD  08/11/2023 2:43 PM    [facility] [address] [place], [place], [place]  [address]  Phone: [phone]; Fax: [phone]

## 2024-01-01 ENCOUNTER — Other Ambulatory Visit: Payer: Self-pay | Admitting: Internal Medicine

## 2024-01-01 DIAGNOSIS — F17200 Nicotine dependence, unspecified, uncomplicated: Secondary | ICD-10-CM

## 2024-01-07 ENCOUNTER — Ambulatory Visit
Admission: RE | Admit: 2024-01-07 | Discharge: 2024-01-07 | Disposition: A | Discharge status: Home / Self Care | Source: Ambulatory Visit | Attending: Internal Medicine | Admitting: Internal Medicine

## 2024-01-07 DIAGNOSIS — F17200 Nicotine dependence, unspecified, uncomplicated: Secondary | ICD-10-CM

## 2024-04-14 ENCOUNTER — Telehealth: Payer: Self-pay | Admitting: Gastroenterology

## 2024-04-14 NOTE — Telephone Encounter (Signed)
 Good Afternoon Dr. Legrand,   I received a call from this patient wife, requesting to know if her husband is needing to make an OV to see if he is still needing to have colonoscopy done or if he can just schedule for a Direct colon. Please advise.    Thank you.

## 2024-04-16 ENCOUNTER — Encounter: Payer: Self-pay | Admitting: Gastroenterology

## 2024-04-16 NOTE — Telephone Encounter (Signed)
 Thanks for the note.  Age 78 with Hx heart disease and prior CABG, so need to be evaluated in clinic by one of our APPs.     Thanks  - H. Danis

## 2024-06-03 ENCOUNTER — Ambulatory Visit: Admitting: Gastroenterology

## 2024-09-30 ENCOUNTER — Ambulatory Visit: Admitting: Cardiovascular Disease
